# Patient Record
Sex: Female | Born: 1990
Health system: Southern US, Community
[De-identification: ages and names within clinical notes are randomized; demographics above are authoritative.]

## PROBLEM LIST (undated history)

## (undated) DIAGNOSIS — Z8042 Family history of malignant neoplasm of prostate: Secondary | ICD-10-CM

## (undated) DIAGNOSIS — N390 Urinary tract infection, site not specified: Secondary | ICD-10-CM

## (undated) DIAGNOSIS — Z803 Family history of malignant neoplasm of breast: Secondary | ICD-10-CM

## (undated) HISTORY — DX: Urinary tract infection, site not specified: N39.0

## (undated) HISTORY — DX: Family history of malignant neoplasm of breast: Z80.3

## (undated) HISTORY — DX: Family history of malignant neoplasm of prostate: Z80.42

## (undated) HISTORY — PX: WISDOM TOOTH EXTRACTION: SHX21

---
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
## 2010-10-02 ENCOUNTER — Emergency Department: Payer: Self-pay | Admitting: Emergency Medicine

## 2013-10-23 ENCOUNTER — Emergency Department (HOSPITAL_COMMUNITY)
Admission: EM | Admit: 2013-10-23 | Discharge: 2013-10-24 | Disposition: A | Discharge status: Home / Self Care | Payer: Worker's Compensation | Attending: Emergency Medicine | Admitting: Emergency Medicine

## 2013-10-23 ENCOUNTER — Encounter (HOSPITAL_COMMUNITY): Payer: Self-pay | Admitting: Emergency Medicine

## 2013-10-23 DIAGNOSIS — Y9289 Other specified places as the place of occurrence of the external cause: Secondary | ICD-10-CM | POA: Diagnosis not present

## 2013-10-23 DIAGNOSIS — Z23 Encounter for immunization: Secondary | ICD-10-CM | POA: Insufficient documentation

## 2013-10-23 DIAGNOSIS — IMO0001 Reserved for inherently not codable concepts without codable children: Secondary | ICD-10-CM

## 2013-10-23 DIAGNOSIS — IMO0002 Reserved for concepts with insufficient information to code with codable children: Secondary | ICD-10-CM | POA: Diagnosis present

## 2013-10-23 DIAGNOSIS — Y9389 Activity, other specified: Secondary | ICD-10-CM | POA: Insufficient documentation

## 2013-10-23 DIAGNOSIS — W460XXA Contact with hypodermic needle, initial encounter: Secondary | ICD-10-CM | POA: Insufficient documentation

## 2013-10-23 DIAGNOSIS — S6982XA Other specified injuries of left wrist, hand and finger(s), initial encounter: Secondary | ICD-10-CM

## 2013-10-23 LAB — RAPID HIV SCREEN (WH-MAU): Rapid HIV Screen: NONREACTIVE

## 2013-10-23 NOTE — ED Notes (Signed)
Pt. accidentally stuck by a needle in a trash bag at work [employer]/film[employer]video( [facility] ) this evening at left index finger tip , no bleeding , pt. stated the needle penetrated her glove . No bleeding .

## 2013-10-23 NOTE — ED Provider Notes (Signed)
CSN: [id number]     Arrival date & time 10/23/13  2200 History   First MD Initiated Contact with Patient 10/23/13 2309     This chart was scribed for non-physician practitioner, Dierdre Forth PA-C working with April Smitty Cords, MD by Arlan Organ, ED Scribe. This patient was seen in room TR07C/TR07C and the patient's care was started at 12:02 AM.   No chief complaint on file.  The history is provided by the patient and medical records. No language interpreter was used.    HPI Comments: Paula Lawrence is a 23 y.o. female who presents to the Emergency Department complaining of a possible minor puncture wound to the L index finger onset prior to arrival. Pt states she was taking the trash out at her [employer]) when she felt a "stick" from something sharp in the trash bag. She reports cleaning the area very well prior to arrival. Pt is here for precaution evaluation as there is some possibility that this was a needle stick. No fever or chills at this time. She is not currently taking any daily medications. She is unaware of her Tetanus status. No known allergies to medications. Pt has no pertinent past medical history. No other concerns this visit.   History reviewed. No pertinent past medical history. History reviewed. No pertinent past surgical history. No family history on file. History  Substance Use Topics  . Smoking status: Never Smoker   . Smokeless tobacco: Not on file  . Alcohol Use: Yes   OB History   Grav Para Term Preterm Abortions TAB SAB Ect Mult Living                 Review of Systems  Constitutional: Negative for fever, chills, diaphoresis, appetite change, fatigue and unexpected weight change.  HENT: Negative for mouth sores.   Eyes: Negative for visual disturbance.  Respiratory: Negative for cough, chest tightness, shortness of breath and wheezing.   Cardiovascular: Negative for chest pain.  Gastrointestinal: Negative for nausea, vomiting,  abdominal pain, diarrhea and constipation.  Endocrine: Negative for polydipsia, polyphagia and polyuria.  Genitourinary: Negative for dysuria, urgency, frequency and hematuria.  Musculoskeletal: Negative for back pain and neck stiffness.  Skin: Negative for rash.  Allergic/Immunologic: Negative for immunocompromised state.  Neurological: Negative for syncope, light-headedness and headaches.  Hematological: Does not bruise/bleed easily.  Psychiatric/Behavioral: Negative for sleep disturbance. The patient is not nervous/anxious.   All other systems reviewed and are negative.     Allergies  Review of patient's allergies indicates no known allergies.  Home Medications   Prior to Admission medications   Not on File   Triage Vitals: BP 133/77  Pulse 72  Temp(Src) 98.8 F (37.1 C) (Oral)  Resp 14  Ht 5\' 4"  (1.626 m)  Wt 208 lb (94.348 kg)  BMI 35.69 kg/m2  SpO2 100%   Physical Exam  Nursing note and vitals reviewed. Constitutional: She is oriented to person, place, and time. She appears well-developed and well-nourished. No distress.  Awake, alert, nontoxic appearance  HENT:  Head: Normocephalic and atraumatic.  Mouth/Throat: Oropharynx is clear and moist. No oropharyngeal exudate.  Eyes: Conjunctivae are normal. No scleral icterus.  Neck: Normal range of motion. Neck supple.  Cardiovascular: Normal rate, regular rhythm, normal heart sounds and intact distal pulses.   No murmur heard. Pulmonary/Chest: Effort normal and breath sounds normal. No respiratory distress. She has no wheezes.  Abdominal: Soft. Bowel sounds are normal. She exhibits no mass. There is no tenderness.  There is no rebound and no guarding.  Musculoskeletal: Normal range of motion. She exhibits no edema.  Neurological: She is alert and oriented to person, place, and time.  Speech is clear and goal oriented Moves extremities without ataxia  Skin: Skin is warm and dry. She is not diaphoretic. No erythema.   Inspection of the left pointer finger does not reveal any visible puncture wounds; no bleeding  Psychiatric: She has a normal mood and affect.    ED Course  Procedures (including critical care time)  DIAGNOSTIC STUDIES: Oxygen Saturation is 100% on RA, Normal by my interpretation.    COORDINATION OF CARE: 12:05 AM- Will order Rapid HIV and Hepatitis panel. Discussed treatment plan with pt at bedside and pt agreed to plan.     Labs Review Labs Reviewed  RAPID HIV SCREEN ([facility])  HEPATITIS PANEL, ACUTE    Imaging Review No results found.   EKG Interpretation None      MDM   Final diagnoses:  Needle stick injury of finger, left, initial encounter   Paula Lawrence presents with possible puncture wounds and concern for possible needle stick.  HIV and hepatitis panel drawn tonight. Tetanus updated.  [facility].  Wound cleaned again here in the emergency department. No visible puncture wound on exam.  Recommend that she keep clean with close observation and return here to the emergency department for signs of infection including fever, chills, erythema or purulent drainage. Patient is to followup with the health department as directed.  ,BP 133/77  Pulse 72  Temp(Src) 98.8 F (37.1 C) (Oral)  Resp 14  Ht 5\' 4"  (1.626 m)  Wt 208 lb (94.348 kg)  BMI 35.69 kg/m2  SpO2 100%   I personally performed the services described in this documentation, which was scribed in my presence. The recorded information has been reviewed and is accurate.    Dahlia ClientHannah Samirah Scarpati, PA-C 10/24/13 0023

## 2013-10-24 LAB — HEPATITIS PANEL, ACUTE
HCV Ab: NEGATIVE
Hep A IgM: NONREACTIVE
Hep B C IgM: NONREACTIVE
Hepatitis B Surface Ag: NEGATIVE

## 2013-10-24 MED ORDER — TETANUS-DIPHTH-ACELL PERTUSSIS 5-2.5-18.5 LF-MCG/0.5 IM SUSP
0.5000 mL | Freq: Once | INTRAMUSCULAR | Status: AC
  Administered 2013-10-24: 0.5 mL via INTRAMUSCULAR
  Filled 2013-10-24: qty 0.5

## 2013-10-24 NOTE — ED Provider Notes (Signed)
Medical screening examination/treatment/procedure(s) were performed by non-physician practitioner and as supervising physician I was immediately available for consultation/collaboration.   EKG Interpretation None       Marshell Dilauro K Carel Carrier-Rasch, MD 10/24/13 [phone]

## 2013-10-24 NOTE — Discharge Instructions (Signed)
1. Medications: usual home medications 2. Treatment: rest, drink plenty of fluids, keep wound clean with warm soap and water 3. Follow Up: Please [facility] for further evaluation and testing as needed    Needle Stick Injury A needle stick injury occurs when you are stuck by a needle that may have the blood from another person on it. Most of the time these injuries heal without any problem, but several diseases can be transmitted this way. You should be aware of the risks. A needle stick injury can cause risk for getting:  Hepatitis B.  Hepatitis C.  HIV infection (the virus that causes AIDS). The chance of getting one of these infections from a needle stick injury is small. However, it is important to take proper precautions to prevent such an injury. It is also important to understand and follow some health care recommendations when such an injury occurs.  RISK FACTORS In general, the risk of infection after a needle stick injury appears to be higher with:  Exposure to a needle that is visibly contaminated with blood.  Exposure to the blood of a patient with an advanced disease or with a high viral load.  A deep injury.  Needle placement in a vein or artery. PREVENTION  All health care workers should:  Wash their hands often, including before and after caring for each patient.  Receive the hepatitis B vaccine before any possible exposure to blood or bloody body fluids.  Use personal protective equipment (PPE) when appropriate. This includes:  Gloves.  Gowns.  Boots.  Shoe covers.  Eyewear.  Masks.  Wear gloves when any kind of venous or arterial access is being done.  Use safety devices when available.  Use sharp edges and needles with caution.  Dispose of used needles and other sharps in puncture proof receptacles.  Never recap needles. TREATMENT   After a needle stick injury, immediate cleansing with soap and water or  an alcohol-based hand hygiene agent is needed.  If you did not have a tetanus booster within the past 10 years, a booster shot should be given.  If the puncture site becomes red, swollen, more painful, or drains yellowish-white fluid (pus), medicine for a bacterial infection may be needed.  If the blood on the needle is known or thought to be high risk for hepatitis B or HIV, additional treatment is needed.  For needle stick exposures to the HIV virus, drug treatment is advised. This treatment is called post-exposure prophylaxis (PEP) and should be started as soon as possible following the injury. The recommended period of treatment with medicines is usually 4 weeks with 2 or more different drugs. You should have follow-up counseling and a medical evaluation, including HIV blood tests, right away. The tests should be repeated at 6 weeks, 3 months, and 6 months. Blood tests to monitor for drug toxicity effects of the PEP medicines are usually recommended immediately before treatment starts and again at 2 weeks and 4 weeks after the start of PEP. Additional recommendations during the first 6 to 12 weeks after exposure include:  Practicing sexual abstinence or using condoms to prevent sexual transmission and to avoid pregnancy.  Refraining from donating blood, plasma, semen, organs, or other tissue.  For breastfeeding women, considering temporary discontinuation of breastfeeding while on PEP.  For needle stick exposures to hepatitis B, blood testing and PEP is also needed. If you have not been vaccinated against hepatitis B, this vaccine series should be started  and hepatitis B immune globulin should also be given. If you have been previously vaccinated, your status of immunity to infection with hepatitis B can be tested by a blood antibody test. Before or after those test results are available, repeat vaccination with or without hepatitis B immune globulin will be considered.  Unfortunately, no  helpful treatment following hepatitis C exposure has been identified or is recommended. Follow-up blood testing is advised over a period of 4 weeks to 6 months to determine if the needle stick led to an infection. Ask your caregiver for advice about this follow-up testing. HOME CARE INSTRUCTIONS  Take medicine exactly as told by your caregiver.  Keep all follow-up appointments.  Do not share personal hygiene items. SEEK IMMEDIATE MEDICAL CARE IF:  You have concerns about your injury, treatment, or follow-up.  The injury site becomes red, swollen, or painful.  The injury site drains pus. MAKE SURE YOU:  Understand these instructions.  Will watch your condition.  Will get help right away if you are not doing well or get worse. Document Released: 03/15/2005 Document Revised: 07/30/2013 Document Reviewed: 08/18/2010 [facility] Patient Information 2015 [place], [place]. This information is not intended to replace advice given to you by your health care provider. Make sure you discuss any questions you have with your health care provider.

## 2014-08-18 ENCOUNTER — Encounter (HOSPITAL_COMMUNITY): Payer: Self-pay | Admitting: *Deleted

## 2014-08-18 ENCOUNTER — Emergency Department (HOSPITAL_COMMUNITY)
Admission: EM | Admit: 2014-08-18 | Discharge: 2014-08-18 | Disposition: A | Discharge status: Home / Self Care | Payer: Self-pay | Attending: Emergency Medicine | Admitting: Emergency Medicine

## 2014-08-18 DIAGNOSIS — R109 Unspecified abdominal pain: Secondary | ICD-10-CM | POA: Insufficient documentation

## 2014-08-18 DIAGNOSIS — Z3202 Encounter for pregnancy test, result negative: Secondary | ICD-10-CM | POA: Insufficient documentation

## 2014-08-18 LAB — PREGNANCY, URINE: Preg Test, Ur: NEGATIVE

## 2014-08-18 LAB — URINALYSIS, ROUTINE W REFLEX MICROSCOPIC
Bilirubin Urine: NEGATIVE
Glucose, UA: NEGATIVE mg/dL
KETONES UR: NEGATIVE mg/dL
Leukocytes, UA: NEGATIVE
Nitrite: NEGATIVE
PH: 6 (ref 5.0–8.0)
PROTEIN: NEGATIVE mg/dL
Specific Gravity, Urine: 1.009 (ref 1.005–1.030)
UROBILINOGEN UA: 0.2 mg/dL (ref 0.0–1.0)

## 2014-08-18 LAB — URINE MICROSCOPIC-ADD ON

## 2014-08-18 LAB — I-STAT CHEM 8, ED
BUN: 7 mg/dL (ref 6–20)
Calcium, Ion: 1.18 mmol/L (ref 1.12–1.23)
Chloride: 99 mmol/L — ABNORMAL LOW (ref 101–111)
Creatinine, Ser: 0.9 mg/dL (ref 0.44–1.00)
Glucose, Bld: 121 mg/dL — ABNORMAL HIGH (ref 65–99)
HCT: 36 % (ref 36.0–46.0)
Hemoglobin: 12.2 g/dL (ref 12.0–15.0)
POTASSIUM: 3.4 mmol/L — AB (ref 3.5–5.1)
SODIUM: 140 mmol/L (ref 135–145)
TCO2: 26 mmol/L (ref 0–100)

## 2014-08-18 MED ORDER — HYDROCODONE-ACETAMINOPHEN 5-325 MG PO TABS
2.0000 | ORAL_TABLET | Freq: Once | ORAL | Status: AC
  Administered 2014-08-18: 2 via ORAL
  Filled 2014-08-18: qty 2

## 2014-08-18 MED ORDER — HYDROCODONE-ACETAMINOPHEN 5-325 MG PO TABS: 1.0000 | ORAL_TABLET | Freq: Four times a day (QID) | ORAL | Status: DC | PRN

## 2014-08-18 NOTE — ED Provider Notes (Signed)
CSN: [id number]     Arrival date & time 08/18/14  2121 History   First MD Initiated Contact with Patient 08/18/14 2207     Chief Complaint  Patient presents with  . Flank Pain     (Consider location/radiation/quality/duration/timing/severity/associated sxs/prior Treatment) HPI Comments: Patient with no pertinent past medical history presents to the emergency department with chief complaint of left flank pain and "pressure"when she urinates. States symptoms have been ongoing for the past 3 days. She reports one episode of associated vomiting. She denies any fevers, chills, chest pain, shortness of breath, diarrhea, constipation, dysuria, or vaginal discharge. There are no aggravating or alleviating factors. Denies any pelvic pain. States pain is moderate.  The history is provided by the patient. No language interpreter was used.    History reviewed. No pertinent past medical history. Past Surgical History  Procedure Laterality Date  . Wisdom tooth extraction     No family history on file. History  Substance Use Topics  . Smoking status: Never Smoker   . Smokeless tobacco: Not on file  . Alcohol Use: Yes   OB History    No data available     Review of Systems  Constitutional: Negative for fever and chills.  Respiratory: Negative for shortness of breath.   Cardiovascular: Negative for chest pain.  Gastrointestinal: Positive for abdominal pain. Negative for nausea, vomiting, diarrhea and constipation.  Genitourinary: Negative for dysuria.  All other systems reviewed and are negative.     Allergies  Review of patient's allergies indicates no known allergies.  Home Medications   Prior to Admission medications   Medication Sig Start Date End Date Taking? Authorizing Provider  ibuprofen (ADVIL,MOTRIN) 200 MG tablet Take 400 mg by mouth every 6 (six) hours as needed for moderate pain.   Yes Historical Provider, MD   BP 117/80 mmHg  Pulse 82  Temp(Src) 98.3 F (36.8 C)  (Oral)  Resp 18  Ht 5\' 4"  (1.626 m)  Wt 224 lb (101.606 kg)  BMI 38.43 kg/m2  SpO2 100%  LMP 08/14/2014 Physical Exam  Constitutional: She is oriented to person, place, and time. She appears well-developed and well-nourished.  HENT:  Head: Normocephalic and atraumatic.  Eyes: Conjunctivae and EOM are normal. Pupils are equal, round, and reactive to light.  Neck: Normal range of motion. Neck supple.  Cardiovascular: Normal rate and regular rhythm.  Exam reveals no gallop and no friction rub.   No murmur heard. Pulmonary/Chest: Effort normal and breath sounds normal. No respiratory distress. She has no wheezes. She has no rales. She exhibits no tenderness.  Abdominal: Soft. Bowel sounds are normal. She exhibits no distension and no mass. There is no tenderness. There is no rebound and no guarding.  No focal abdominal tenderness, no RLQ tenderness or pain at McBurney's point, no RUQ tenderness or Murphy's sign, no left-sided abdominal tenderness, no fluid wave, or signs of peritonitis   Musculoskeletal: Normal range of motion. She exhibits no edema or tenderness.  Neurological: She is alert and oriented to person, place, and time.  Skin: Skin is warm and dry.  Psychiatric: She has a normal mood and affect. Her behavior is normal. Judgment and thought content normal.  Nursing note and vitals reviewed.   ED Course  Procedures (including critical care time) Results for orders placed or performed during the hospital encounter of 08/18/14  U/A (may I&O cath if menses)  Result Value Ref Range   Color, Urine YELLOW YELLOW   APPearance CLEAR CLEAR  Specific Gravity, Urine 1.009 1.005 - 1.030   pH 6.0 5.0 - 8.0   Glucose, UA NEGATIVE NEGATIVE mg/dL   Hgb urine dipstick TRACE (A) NEGATIVE   Bilirubin Urine NEGATIVE NEGATIVE   Ketones, ur NEGATIVE NEGATIVE mg/dL   Protein, ur NEGATIVE NEGATIVE mg/dL   Urobilinogen, UA 0.2 0.0 - 1.0 mg/dL   Nitrite NEGATIVE NEGATIVE   Leukocytes, UA  NEGATIVE NEGATIVE  Pregnancy, urine  Result Value Ref Range   Preg Test, Ur NEGATIVE NEGATIVE  Urine microscopic-add on  Result Value Ref Range   Squamous Epithelial / LPF FEW (A) RARE   RBC / HPF 0-2 <3 RBC/hpf  I-stat chem 8, ed  Result Value Ref Range   Sodium 140 135 - 145 mmol/L   Potassium 3.4 (L) 3.5 - 5.1 mmol/L   Chloride 99 (L) 101 - 111 mmol/L   BUN 7 6 - 20 mg/dL   Creatinine, Ser 1.19 0.44 - 1.00 mg/dL   Glucose, Bld 147 (H) 65 - 99 mg/dL   Calcium, Ion 8.29 5.62 - 1.23 mmol/L   TCO2 26 0 - 100 mmol/L   Hemoglobin 12.2 12.0 - 15.0 g/dL   HCT 13.0 86.5 - 78.4 %   No results found.    EKG Interpretation None      MDM   Final diagnoses:  Flank pain    Patient has had some left flank pain 3 days. She also reports urinary pressure. She is very well-appearing. Abdomen is soft and nontender. Will check urinalysis and chem 8. Will add urine pregnancy.  11:12 PM Patient with left flank pain and some pressure with urination.  UA is negative.  Will check urine culture.  I discussed with the patient that I do not think that there is an acute abdomen or emergent process, but could not find the etiology of patient's pain.  I offered her CT to further investigate the pain, but patient declines this today.  I instructed her to return to the ED if her symptoms worsen.  Patient understands and agrees with the plan.  The pain is not in the patient's pelvis, doubt ovarian etiology, no diarrhea, Cr is normal, trace Hgb in urine, but patient is just finishing her period and she is not pregnant.  Feel that the patient can be safely discharged to home with return precautions.  She understands and agrees with the plan.  Filed Vitals:   08/18/14 2317  BP: 139/71  Pulse: 65  Temp:   Resp: [address], PA-C 08/18/14 2322  Tilden Fossa, MD 08/18/14 [phone]

## 2014-08-18 NOTE — ED Notes (Signed)
Pt alert, oriented,and ambulatory upon DC. He was advised to follow up if not better. SH leaves with DC papers and script for vicodin in hand.

## 2014-08-18 NOTE — ED Notes (Signed)
Pt reports L flank pain with n/v since Friday.  Denies dysuria or hematuria but reports pressure when urinating.

## 2014-08-18 NOTE — Discharge Instructions (Signed)

## 2014-08-18 NOTE — ED Notes (Signed)
Patient is having pressure when she urinates. Patient also is having pain in left side and in pelvic area.

## 2014-08-20 LAB — URINE CULTURE

## 2014-09-28 ENCOUNTER — Encounter (HOSPITAL_COMMUNITY): Payer: Self-pay | Admitting: Emergency Medicine

## 2014-09-28 ENCOUNTER — Emergency Department (INDEPENDENT_AMBULATORY_CARE_PROVIDER_SITE_OTHER)
Admission: EM | Admit: 2014-09-28 | Discharge: 2014-09-28 | Disposition: A | Discharge status: Home / Self Care | Payer: Self-pay | Source: Home / Self Care | Attending: Emergency Medicine | Admitting: Emergency Medicine

## 2014-09-28 DIAGNOSIS — L0291 Cutaneous abscess, unspecified: Secondary | ICD-10-CM

## 2014-09-28 MED ORDER — HYDROCODONE-ACETAMINOPHEN 7.5-325 MG PO TABS: 1.0000 | ORAL_TABLET | ORAL | Status: DC | PRN

## 2014-09-28 MED ORDER — SULFAMETHOXAZOLE-TRIMETHOPRIM 800-160 MG PO TABS: 1.0000 | ORAL_TABLET | Freq: Two times a day (BID) | ORAL | Status: DC

## 2014-09-28 NOTE — ED Provider Notes (Signed)
CSN: [id number]     Arrival date & time 09/28/14  1625 History   First MD Initiated Contact with Patient 09/28/14 1902     Chief Complaint  Patient presents with  . Abscess   (Consider location/radiation/quality/duration/timing/severity/associated sxs/prior Treatment) HPI Comments: 24 year old female states that she developed a boil under the left arm 2 days ago. Is continuing get larger and more painful.   History reviewed. No pertinent past medical history. Past Surgical History  Procedure Laterality Date  . Wisdom tooth extraction     No family history on file. History  Substance Use Topics  . Smoking status: Never Smoker   . Smokeless tobacco: Not on file  . Alcohol Use: Yes   OB History    No data available     Review of Systems  Constitutional: Negative.   Skin: Negative for rash.       As per history of present illness.  All other systems reviewed and are negative.   Allergies  Review of patient's allergies indicates no known allergies.  Home Medications   Prior to Admission medications   Medication Sig Start Date End Date Taking? Authorizing Provider  HYDROcodone-acetaminophen (NORCO) 7.5-325 MG per tablet Take 1 tablet by mouth every 4 (four) hours as needed. 09/28/14   Hayden Rasmussenavid Anniah Glick, NP  ibuprofen (ADVIL,MOTRIN) 200 MG tablet Take 400 mg by mouth every 6 (six) hours as needed for moderate pain.    Historical Provider, MD  sulfamethoxazole-trimethoprim (BACTRIM DS,SEPTRA DS) 800-160 MG per tablet Take 1 tablet by mouth 2 (two) times daily. 09/28/14 10/05/14  Hayden Rasmussenavid Chaka Jefferys, NP   BP 146/82 mmHg  Pulse 88  Temp(Src) 98 F (36.7 C) (Oral)  Resp 18  SpO2 98%  LMP 09/14/2014 Physical Exam  Constitutional: She is oriented to person, place, and time. She appears well-developed and well-nourished. No distress.  Pulmonary/Chest: Effort normal. No respiratory distress.  Neurological: She is alert and oriented to person, place, and time. She exhibits normal muscle tone.  Skin:  Skin is warm and dry.  Proximally 6 cm abscess to the left axilla. Fluctuant, tender with mild erythema. No surrounding erythema to help the scan.  Nursing note and vitals reviewed.   ED Course  INCISION AND DRAINAGE Date/Time: 09/28/2014 7:38 PM Performed by: Phineas RealMABE, Krystiana Fornes Authorized by: Charm RingsHONIG, ERIN J Consent: Verbal consent obtained. Risks and benefits: risks, benefits and alternatives were discussed Consent given by: patient Patient understanding: patient states understanding of the procedure being performed Patient identity confirmed: verbally with patient Type: abscess Body area: upper extremity Location details: left arm Anesthesia: local infiltration Local anesthetic: lidocaine 2% with epinephrine Anesthetic total: 8 ml Patient sedated: no Scalpel size: 11 Incision type: single with marsupialization Complexity: complex Drainage: purulent Drainage amount: copious Wound treatment: drain placed Packing material: 1/4 in iodoform gauze Patient tolerance: Patient tolerated the procedure well with no immediate complications   (including critical care time) Labs Review Labs Reviewed  CULTURE, ROUTINE-ABSCESS    Imaging Review No results found.   MDM   1. Abscess    Keep dry RTO 2 d Septra dws bid norco 7.5 #15 Wound culture pending   Hayden Rasmussenavid Lathan Gieselman, NP 09/28/14 1939

## 2014-09-28 NOTE — ED Notes (Signed)
Abscess under left arm.  History of the same.  Patient has never needed to have I/D performed on abscess.  Noticed abscess on Thursday 6/30

## 2014-09-28 NOTE — Discharge Instructions (Signed)

## 2014-10-01 ENCOUNTER — Encounter (HOSPITAL_COMMUNITY): Payer: Self-pay | Admitting: Emergency Medicine

## 2014-10-01 ENCOUNTER — Emergency Department (INDEPENDENT_AMBULATORY_CARE_PROVIDER_SITE_OTHER)
Admission: EM | Admit: 2014-10-01 | Discharge: 2014-10-01 | Disposition: A | Discharge status: Home / Self Care | Payer: Self-pay | Source: Home / Self Care | Attending: Emergency Medicine | Admitting: Emergency Medicine

## 2014-10-01 DIAGNOSIS — L02412 Cutaneous abscess of left axilla: Secondary | ICD-10-CM

## 2014-10-01 MED ORDER — DOXYCYCLINE HYCLATE 100 MG PO CAPS: 100.0000 mg | ORAL_CAPSULE | Freq: Two times a day (BID) | ORAL | Status: DC

## 2014-10-01 NOTE — ED Notes (Signed)
Here for a f/u and to have abscess checked Reports she had an allergic reaction to Bactrim... Started to have a rash around mouth and itching of lips She stopped taking it... Reports she is feeling much better Alert, no signs of acute distress.

## 2014-10-01 NOTE — ED Notes (Signed)
Applied 4*4 dressing and secured w/hypofix

## 2014-10-01 NOTE — ED Notes (Signed)
Final report of abscess culture still pending

## 2014-10-01 NOTE — ED Provider Notes (Signed)
CSN: [id number]     Arrival date & time 10/01/14  1651 History   First MD Initiated Contact with Patient 10/01/14 1841     Chief Complaint  Patient presents with  . Follow-up   (Consider location/radiation/quality/duration/timing/severity/associated sxs/prior Treatment) HPI  She is a 24 year old woman here for follow-up of left axillary abscess. She was seen here 2 days ago and an I&D was performed. She is here today for packing removal. She was placed on Bactrim 2 days ago, but developed a rash. She called the office and she was instructed to stop taking the antibiotics. She states she had significant drainage from the abscess yesterday, but this is improved today. No fevers or chills.  History reviewed. No pertinent past medical history. Past Surgical History  Procedure Laterality Date  . Wisdom tooth extraction     No family history on file. History  Substance Use Topics  . Smoking status: Never Smoker   . Smokeless tobacco: Not on file  . Alcohol Use: Yes   OB History    No data available     Review of Systems As in history of present illness Allergies  Review of patient's allergies indicates no known allergies.  Home Medications   Prior to Admission medications   Medication Sig Start Date End Date Taking? Authorizing Provider  doxycycline (VIBRAMYCIN) 100 MG capsule Take 1 capsule (100 mg total) by mouth 2 (two) times daily. 10/01/14   Charm RingsErin J Khoury Siemon, MD  HYDROcodone-acetaminophen (NORCO) 7.5-325 MG per tablet Take 1 tablet by mouth every 4 (four) hours as needed. 09/28/14   Hayden Rasmussenavid Mabe, NP  ibuprofen (ADVIL,MOTRIN) 200 MG tablet Take 400 mg by mouth every 6 (six) hours as needed for moderate pain.    Historical Provider, MD   BP 126/86 mmHg  Pulse 67  Temp(Src) 98 F (36.7 C) (Oral)  Resp 20  SpO2 99%  LMP 09/14/2014 Physical Exam  Constitutional: She is oriented to person, place, and time. She appears well-developed and well-nourished. No distress.  Cardiovascular:  Normal rate.   Pulmonary/Chest: Effort normal.  Neurological: She is alert and oriented to person, place, and time.  Skin:  Left axillary abscess packing removed. There is still active drainage. Some mild induration in the area. No surrounding erythema.    ED Course  Procedures (including critical care time)  Verbal consent obtained for packing removal and repacking. Packing was removed. Quarter-inch iodoform gauze was used to repack the wound. Bandage applied. Patient tolerated procedure well.  Labs Review Labs Reviewed - No data to display  Imaging Review No results found.   MDM   1. Abscess of axilla, left    Repacked today. If there is significant drainage in 2 days, she will return for reevaluation. Otherwise, she will remove the packing herself. Prescription for doxycycline given. She does not need to fill this unless redness and swelling occurs.    Charm RingsErin J Kannon Baum, MD 10/01/14 [phone]

## 2014-10-01 NOTE — Discharge Instructions (Signed)
We repacked the abscess today. You do not need to fill the antibiotics unless the area becomes red and swollen. If you are having a lot of drainage, please come back in 2 days. If there is not much drainage, you can remove the packing herself in 2 days.

## 2014-10-03 LAB — CULTURE, ROUTINE-ABSCESS: SPECIAL REQUESTS: NORMAL

## 2015-06-28 ENCOUNTER — Emergency Department (INDEPENDENT_AMBULATORY_CARE_PROVIDER_SITE_OTHER)
Admission: EM | Admit: 2015-06-28 | Discharge: 2015-06-28 | Disposition: A | Discharge status: Home / Self Care | Payer: Self-pay | Source: Home / Self Care | Attending: Emergency Medicine | Admitting: Emergency Medicine

## 2015-06-28 ENCOUNTER — Other Ambulatory Visit (HOSPITAL_COMMUNITY)
Admission: RE | Admit: 2015-06-28 | Discharge: 2015-06-28 | Disposition: A | Discharge status: Home / Self Care | Payer: Self-pay | Source: Ambulatory Visit | Attending: Emergency Medicine | Admitting: Emergency Medicine

## 2015-06-28 ENCOUNTER — Encounter (HOSPITAL_COMMUNITY): Payer: Self-pay | Admitting: Emergency Medicine

## 2015-06-28 DIAGNOSIS — L0291 Cutaneous abscess, unspecified: Secondary | ICD-10-CM | POA: Insufficient documentation

## 2015-06-28 MED ORDER — CLINDAMYCIN HCL 300 MG PO CAPS: 300.0000 mg | ORAL_CAPSULE | Freq: Four times a day (QID) | ORAL | Status: DC

## 2015-06-28 MED ORDER — LIDOCAINE HCL (PF) 1 % IJ SOLN
INTRAMUSCULAR | Status: AC
  Filled 2015-06-28: qty 30

## 2015-06-28 NOTE — ED Notes (Signed)
Patient c/o abscess in left axilla x 2 days. Patient reports it appeared last week and began to drain on its own. Now it has come to a head again and is painful. This is a recurrent problem she frequently has to get I&D. Patient is in NAD.

## 2015-06-28 NOTE — ED Provider Notes (Signed)
CSN: [id number]     Arrival date & time 06/28/15  1344 History   First MD Initiated Contact with Patient 06/28/15 1543     Chief Complaint  Patient presents with  . Abscess   (Consider location/radiation/quality/duration/timing/severity/associated sxs/prior Treatment) HPI History obtained from patient:   LOCATION:left axilla SEVERITY:6 DURATION:several days CONTEXT:suden onset, shaved hair a couple of weeks ago.  QUALITY:similar to previous abscess MODIFYING FACTORS:hot packs ASSOCIATED SYMPTOMS:none TIMING:constant OCCUPATION:NA  History reviewed. No pertinent past medical history. Past Surgical History  Procedure Laterality Date  . Wisdom tooth extraction     History reviewed. No pertinent family history. Social History  Substance Use Topics  . Smoking status: Never Smoker   . Smokeless tobacco: None  . Alcohol Use: Yes   OB History    No data available     Review of Systems Boil under left arm Allergies  Bactrim  Home Medications   Prior to Admission medications   Medication Sig Start Date End Date Taking? Authorizing Provider  clindamycin (CLEOCIN) 300 MG capsule Take 1 capsule (300 mg total) by mouth every 6 (six) hours. 06/28/15   Tharon Aquas, PA  doxycycline (VIBRAMYCIN) 100 MG capsule Take 1 capsule (100 mg total) by mouth 2 (two) times daily. 10/01/14   Charm Rings, MD  HYDROcodone-acetaminophen (NORCO) 7.5-325 MG per tablet Take 1 tablet by mouth every 4 (four) hours as needed. 09/28/14   Hayden Rasmussen, NP  ibuprofen (ADVIL,MOTRIN) 200 MG tablet Take 400 mg by mouth every 6 (six) hours as needed for moderate pain.    Historical Provider, MD   Meds Ordered and Administered this Visit  Medications - No data to display  BP 145/81 mmHg  Pulse 90  Temp(Src) 98.2 F (36.8 C) (Oral)  SpO2 100%  LMP 06/17/2015 No data found.   Physical Exam NURSES NOTES AND VITAL SIGNS REVIEWED. CONSTITUTIONAL: Well developed, well nourished, no acute distress HEENT:  normocephalic, atraumatic EYES: Conjunctiva normal NECK:normal ROM, supple, no adenopathy PULMONARY:No respiratory distress, normal effort MUSCULOSKELETAL: Normal ROM of all extremities,  SKIN: warm and dry without rash. Left axilla, swollen fluctuant, tender to palpation. Visible sites of incisions of the past.  PSYCHIATRIC: Mood and affect, behavior are normal  ED Course  .Marland KitchenIncision and Drainage Date/Time: 06/28/2015 4:24 PM Performed by: Tharon Aquas Authorized by: Charm Rings Consent: Verbal consent obtained. Risks and benefits: risks, benefits and alternatives were discussed Consent given by: patient Patient identity confirmed: verbally with patient Type: abscess Body area: upper extremity Location details: left arm Anesthesia: local infiltration Local anesthetic: co-phenylcaine spray and lidocaine 1% without epinephrine Anesthetic total: 3 ml Patient sedated: no Scalpel size: 11 Incision type: single straight Incision depth: subcutaneous Complexity: simple Drainage: purulent Drainage amount: moderate Wound treatment: drain placed Packing material: 1/4 in iodoform gauze Patient tolerance: Patient tolerated the procedure well with no immediate complications   (including critical care time)  Labs Review Labs Reviewed  CULTURE, ROUTINE-ABSCESS    Imaging Review No results found.   Visual Acuity Review  Right Eye Distance:   Left Eye Distance:   Bilateral Distance:    Right Eye Near:   Left Eye Near:    Bilateral Near:      RX for clindamycin  TX I&D culture obtained    MDM   1. Abscess     Patient is reassured that there are no issues that require transfer to higher level of care at this time or additional tests. Patient is advised to continue  home symptomatic treatment. Patient is advised that if there are new or worsening symptoms to attend the emergency department, contact primary care provider, or return to UC. Instructions of care  provided discharged home in stable condition.    THIS NOTE WAS GENERATED USING A VOICE RECOGNITION SOFTWARE PROGRAM. ALL REASONABLE EFFORTS  WERE MADE TO PROOFREAD THIS DOCUMENT FOR ACCURACY.  I have verbally reviewed the discharge instructions with the patient. A printed AVS was given to the patient.  All questions were answered prior to discharge.      Tharon AquasFrank C Patrick, PA 06/28/15 1625

## 2015-06-28 NOTE — Discharge Instructions (Signed)
Incision and Drainage °Incision and drainage is a procedure in which a sac-like structure (cystic structure) is opened and drained. The area to be drained usually contains material such as pus, fluid, or blood.  °LET YOUR CAREGIVER KNOW ABOUT:  °· Allergies to medicine. °· Medicines taken, including vitamins, herbs, eyedrops, over-the-counter medicines, and creams. °· Use of steroids (by mouth or creams). °· Previous problems with anesthetics or numbing medicines. °· History of bleeding problems or blood clots. °· Previous surgery. °· Other health problems, including diabetes and kidney problems. °· Possibility of pregnancy, if this applies. °RISKS AND COMPLICATIONS °· Pain. °· Bleeding. °· Scarring. °· Infection. °BEFORE THE PROCEDURE  °You may need to have an ultrasound or other imaging tests to see how large or deep your cystic structure is. Blood tests may also be used to determine if you have an infection or how severe the infection is. You may need to have a tetanus shot. °PROCEDURE  °The affected area is cleaned with a cleaning fluid. The cyst area will then be numbed with a medicine (local anesthetic). A small incision will be made in the cystic structure. A syringe or catheter may be used to drain the contents of the cystic structure, or the contents may be squeezed out. The area will then be flushed with a cleansing solution. After cleansing the area, it is often gently packed with a gauze or another wound dressing. Once it is packed, it will be covered with gauze and tape or some other type of wound dressing.  °AFTER THE PROCEDURE  °· Often, you will be allowed to go home right after the procedure. °· You may be given antibiotic medicine to prevent or heal an infection. °· If the area was packed with gauze or some other wound dressing, you will likely need to come back in 1 to 2 days to get it removed. °· The area should heal in about 14 days. °  °This information is not intended to replace advice given  to you by your health care provider. Make sure you discuss any questions you have with your health care provider. °  °Document Released: 09/08/2000 Document Revised: 09/14/2011 Document Reviewed: 05/10/2011 °Elsevier Interactive Patient Education ©2016 [employer] ° °Abscess °An abscess (boil or furuncle) is an infected area on or under the skin. This area is filled with yellowish-white fluid (pus) and other material (debris). °HOME CARE  °· Only take medicines as told by your doctor. °· If you were given antibiotic medicine, take it as directed. Finish the medicine even if you start to feel better. °· If gauze is used, follow your doctor's directions for changing the gauze. °· To avoid spreading the infection: °¨ Keep your abscess covered with a bandage. °¨ Wash your hands well. °¨ Do not share personal care items, towels, or whirlpools with others. °¨ Avoid skin contact with others. °· Keep your skin and clothes clean around the abscess. °· Keep all doctor visits as told. °GET HELP RIGHT AWAY IF:  °· You have more pain, puffiness (swelling), or redness in the wound site. °· You have more fluid or blood coming from the wound site. °· You have muscle aches, chills, or you feel sick. °· You have a fever. °MAKE SURE YOU:  °· Understand these instructions. °· Will watch your condition. °· Will get help right away if you are not doing well or get worse. °  °This information is not intended to replace advice given to you by your health care provider.   Make sure you discuss any questions you have with your health care provider. °  °Document Released: 09/01/2007 Document Revised: 09/14/2011 Document Reviewed: 05/29/2011 °Elsevier Interactive Patient Education ©2016 [employer] Inc[employer] ° °

## 2015-07-03 LAB — CULTURE, ROUTINE-ABSCESS
CULTURE: NORMAL
Special Requests: NORMAL

## 2016-02-04 ENCOUNTER — Ambulatory Visit (HOSPITAL_COMMUNITY)
Admission: EM | Admit: 2016-02-04 | Discharge: 2016-02-04 | Disposition: A | Discharge status: Home / Self Care | Payer: Self-pay | Attending: Family Medicine | Admitting: Family Medicine

## 2016-02-04 ENCOUNTER — Encounter (HOSPITAL_COMMUNITY): Payer: Self-pay | Admitting: *Deleted

## 2016-02-04 DIAGNOSIS — L02412 Cutaneous abscess of left axilla: Secondary | ICD-10-CM

## 2016-02-04 MED ORDER — DOXYCYCLINE HYCLATE 100 MG PO TABS: 100.0000 mg | ORAL_TABLET | Freq: Two times a day (BID) | ORAL | 3 refills | Status: DC

## 2016-02-04 MED ORDER — LIDOCAINE-EPINEPHRINE (PF) 2 %-1:200000 IJ SOLN
INTRAMUSCULAR | Status: AC
  Filled 2016-02-04: qty 20

## 2016-02-04 NOTE — Discharge Instructions (Signed)
Get in the shower and really soak the left armpit twice a day

## 2016-02-04 NOTE — ED Provider Notes (Signed)
[facility]    CSN: [id number] Arrival date & time: 02/04/16  1235     History   Chief Complaint Chief Complaint  Patient presents with  . Abscess    HPI Paula Lawrence is a 25 y.o. female.   This is a 25 year old woman who has recurrent abscess in her left axilla. She's here today with swelling and tenderness, similar to past episodes, which have been going on for a week. She's had this problem several times before.      History reviewed. No pertinent past medical history.  There are no active problems to display for this patient.   Past Surgical History:  Procedure Laterality Date  . WISDOM TOOTH EXTRACTION      OB History    No data available       Home Medications    Prior to Admission medications   Medication Sig Start Date End Date Taking? Authorizing Provider  doxycycline (VIBRA-TABS) 100 MG tablet Take 1 tablet (100 mg total) by mouth 2 (two) times daily. 02/04/16   Elvina SidleKurt Makaela Cando, MD    Family History History reviewed. No pertinent family history.  Social History Social History  Substance Use Topics  . Smoking status: Never Smoker  . Smokeless tobacco: Not on file  . Alcohol use Yes     Allergies   Bactrim [sulfamethoxazole-trimethoprim]   Review of Systems Review of Systems  Constitutional: Negative.   HENT: Negative.   Eyes: Negative.   Respiratory: Negative.   Cardiovascular: Negative.   Gastrointestinal: Negative.   Musculoskeletal: Positive for myalgias.     Physical Exam Triage Vital Signs ED Triage Vitals [02/04/16 1259]  Enc Vitals Group     BP 137/78     Pulse Rate 82     Resp 18     Temp 97.9 F (36.6 C)     Temp Source Oral     SpO2 100 %     Weight      Height      Head Circumference      Peak Flow      Pain Score      Pain Loc      Pain Edu?      Excl. in GC?    No data found.   Updated Vital Signs BP 137/78 (BP Location: Right Arm)   Pulse 82   Temp 97.9 F (36.6 C) (Oral)    Resp 18   LMP 01/06/2016   SpO2 100%      Physical Exam  Constitutional: She appears well-developed and well-nourished.  Musculoskeletal:  Pain with abduction of left arm  Skin: Skin is warm and dry. There is erythema.  Marked tenderness and swelling and left axilla with palpation  Nursing note and vitals reviewed.    UC Treatments / Results  Labs (all labs ordered are listed, but only abnormal results are displayed) Labs Reviewed - No data to display  EKG  EKG Interpretation None       Radiology No results found.  Procedures .Marland Kitchen.Incision and Drainage Date/Time: 02/04/2016 1:37 PM Performed by: Elvina SidleLAUENSTEIN, Lada Fulbright Authorized by: Elvina SidleLAUENSTEIN, Emalynn Clewis   Consent:    Consent obtained:  Verbal   Consent given by:  Patient   Risks discussed:  Incomplete drainage and infection   Alternatives discussed:  No treatment Location:    Type:  Abscess   Size:  1.5 cm   Location:  Upper extremity   Upper extremity location:  Arm   Arm location:  L  upper arm Pre-procedure details:    Skin preparation:  Antiseptic wash Anesthesia (see MAR for exact dosages):    Anesthesia method:  Local infiltration   Local anesthetic:  Lidocaine 1% WITH epi Procedure type:    Complexity:  Simple Procedure details:    Needle aspiration: no     Incision types:  Stab incision   Incision depth:  Subcutaneous   Scalpel blade:  11   Wound management:  Probed and deloculated   Drainage:  Bloody and purulent   Drainage amount:  Copious   Wound treatment:  Wound left open   Packing materials:  None Post-procedure details:    Patient tolerance of procedure:  Tolerated well, no immediate complications   (including critical care time)  Medications Ordered in UC Medications - No data to display   Initial Impression / Assessment and Plan / UC Course  I have reviewed the triage vital signs and the nursing notes.  Pertinent labs & imaging results that were available during my care of the patient  were reviewed by me and considered in my medical decision making (see chart for details).  Clinical Course     Final Clinical Impressions(s) / UC Diagnoses   Final diagnoses:  Abscess of left axilla    New Prescriptions New Prescriptions   DOXYCYCLINE (VIBRA-TABS) 100 MG TABLET    Take 1 tablet (100 mg total) by mouth 2 (two) times daily.     Elvina SidleKurt Reg Bircher, MD 02/04/16 1340

## 2016-02-04 NOTE — ED Triage Notes (Signed)
Pt    Reports       Boil  Under  l  Armpit  X   1   Week      Pt    Reports     The  Boil is  reoccuring      And     It  Is  Not  Draining  At  This  Time

## 2016-05-03 ENCOUNTER — Encounter (HOSPITAL_BASED_OUTPATIENT_CLINIC_OR_DEPARTMENT_OTHER): Payer: Self-pay

## 2016-05-03 DIAGNOSIS — J219 Acute bronchiolitis, unspecified: Secondary | ICD-10-CM | POA: Insufficient documentation

## 2016-05-03 DIAGNOSIS — J209 Acute bronchitis, unspecified: Secondary | ICD-10-CM | POA: Diagnosis not present

## 2016-05-03 DIAGNOSIS — J111 Influenza due to unidentified influenza virus with other respiratory manifestations: Secondary | ICD-10-CM | POA: Diagnosis not present

## 2016-05-03 DIAGNOSIS — R05 Cough: Secondary | ICD-10-CM | POA: Diagnosis present

## 2016-05-03 NOTE — ED Triage Notes (Signed)
C/o flu s/s x 5 days-NAD-steady gait 

## 2016-05-04 ENCOUNTER — Emergency Department (HOSPITAL_BASED_OUTPATIENT_CLINIC_OR_DEPARTMENT_OTHER): Payer: 59

## 2016-05-04 ENCOUNTER — Emergency Department (HOSPITAL_BASED_OUTPATIENT_CLINIC_OR_DEPARTMENT_OTHER)
Admission: EM | Admit: 2016-05-04 | Discharge: 2016-05-04 | Disposition: A | Discharge status: Home / Self Care | Payer: 59 | Attending: Emergency Medicine | Admitting: Emergency Medicine

## 2016-05-04 DIAGNOSIS — R69 Illness, unspecified: Secondary | ICD-10-CM

## 2016-05-04 DIAGNOSIS — J209 Acute bronchitis, unspecified: Secondary | ICD-10-CM

## 2016-05-04 DIAGNOSIS — J219 Acute bronchiolitis, unspecified: Secondary | ICD-10-CM | POA: Diagnosis not present

## 2016-05-04 DIAGNOSIS — R0602 Shortness of breath: Secondary | ICD-10-CM | POA: Diagnosis not present

## 2016-05-04 DIAGNOSIS — J111 Influenza due to unidentified influenza virus with other respiratory manifestations: Secondary | ICD-10-CM

## 2016-05-04 DIAGNOSIS — R05 Cough: Secondary | ICD-10-CM | POA: Diagnosis not present

## 2016-05-04 MED ORDER — ALBUTEROL SULFATE HFA 108 (90 BASE) MCG/ACT IN AERS
2.0000 | INHALATION_SPRAY | RESPIRATORY_TRACT | Status: DC | PRN
  Administered 2016-05-04: 2 via RESPIRATORY_TRACT

## 2016-05-04 MED ORDER — ALBUTEROL SULFATE HFA 108 (90 BASE) MCG/ACT IN AERS
INHALATION_SPRAY | RESPIRATORY_TRACT | Status: AC
  Administered 2016-05-04: 2 via RESPIRATORY_TRACT
  Filled 2016-05-04: qty 6.7

## 2016-05-04 NOTE — ED Provider Notes (Signed)
[facility] Provider Note: Lowella DellJ. Lane Shaun Zuccaro, MD, FACEP  CSN: [id number] MRN: [id number] ARRIVAL: 05/03/16 at 2156 ROOM: [facility]   CHIEF COMPLAINT  Cough   HISTORY OF PRESENT ILLNESS  Paula Lawrence is a 26 y.o. female with a five-day history of flulike symptoms. This began with sore throat and has progressed to include nasal congestion, rhinorrhea, dry cough, body aches, chills, low-grade fever and malaise. Her principal complaint is her cough which is persistent and is causing her chest to be sore. She has had a decreased appetite but no nausea, vomiting or diarrhea. She has been taking over-the-counter cough and cold medications without adequate relief.   History reviewed. No pertinent past medical history.  Past Surgical History:  Procedure Laterality Date  . WISDOM TOOTH EXTRACTION      No family history on file.  Social History  Substance Use Topics  . Smoking status: Never Smoker  . Smokeless tobacco: Never Used  . Alcohol use Yes     Comment: occ    Prior to Admission medications   Not on File    Allergies Bactrim [sulfamethoxazole-trimethoprim]   REVIEW OF SYSTEMS  Negative except as noted here or in the History of Present Illness.   PHYSICAL EXAMINATION  Initial Vital Signs Blood pressure 121/90, pulse 87, temperature 98.6 F (37 C), temperature source Oral, resp. rate 20, height 5\' 5"  (1.651 m), weight 227 lb (103 kg), last menstrual period 04/12/2016, SpO2 100 %.  Examination General: Well-developed, well-nourished female in no acute distress; appearance consistent with age of record HENT: normocephalic; atraumatic; nasal congestion; no pharyngeal erythema or exudate Eyes: pupils equal, round and reactive to light; extraocular muscles intact Neck: supple Heart: regular rate and rhythm Lungs: Decreased air movement in bases; frequent cough, exacerbated by deep breathing Abdomen: soft; nondistended; nontender; bowel sounds  present Extremities: No deformity; full range of motion; pulses normal Neurologic: Awake, alert and oriented; motor function intact in all extremities and symmetric; no facial droop Skin: Warm and dry Psychiatric: Normal mood and affect   RESULTS  Summary of this visit's results, reviewed by myself:   EKG Interpretation  Date/Time:    Ventricular Rate:    PR Interval:    QRS Duration:   QT Interval:    QTC Calculation:   R Axis:     Text Interpretation:        Laboratory Studies: No results found for this or any previous visit (from the past 24 hour(s)). Imaging Studies: Dg Chest 2 View  Result Date: 05/04/2016 CLINICAL DATA:  Cough with shortness of breath EXAM: CHEST  2 VIEW COMPARISON:  None. FINDINGS: The heart size and mediastinal contours are within normal limits. Both lungs are clear. The visualized skeletal structures are unremarkable. IMPRESSION: No active cardiopulmonary disease. Electronically Signed   By: Jasmine PangKim  Fujinaga M.D.   On: 05/04/2016 00:56    ED COURSE  Nursing notes and initial vitals signs, including pulse oximetry, reviewed.  Vitals:   05/03/16 2203 05/04/16 0038  BP: 125/88 121/90  Pulse: 96 87  Resp: 20 20  Temp: 99 F (37.2 C) 98.6 F (37 C)  TempSrc: Oral Oral  SpO2: 100% 100%  Weight: 227 lb (103 kg)   Height: 5\' 5"  (1.651 m)    2:11 AM We will provide an inhaler to treat her acute bronchitis. She was advised to avoid duplicating ingredients in the over-the-counter cough and cold medications she is using. She was advised an NSAID such as ibuprofen or naproxen  sodium plus Mucinex DM is a good combination.  PROCEDURES    ED DIAGNOSES     ICD-9-CM ICD-10-CM   1. Acute bronchitis with bronchospasm 466.0 J20.9   2. Influenza-like illness 799.89 R69        Paula Libra, MD 05/04/16 [phone]

## 2016-05-31 ENCOUNTER — Encounter (HOSPITAL_COMMUNITY): Payer: Self-pay | Admitting: Emergency Medicine

## 2016-05-31 ENCOUNTER — Ambulatory Visit (HOSPITAL_COMMUNITY)
Admission: EM | Admit: 2016-05-31 | Discharge: 2016-05-31 | Disposition: A | Discharge status: Home / Self Care | Payer: 59 | Attending: Family Medicine | Admitting: Family Medicine

## 2016-05-31 DIAGNOSIS — R319 Hematuria, unspecified: Secondary | ICD-10-CM | POA: Diagnosis not present

## 2016-05-31 DIAGNOSIS — R300 Dysuria: Secondary | ICD-10-CM | POA: Diagnosis not present

## 2016-05-31 DIAGNOSIS — N390 Urinary tract infection, site not specified: Secondary | ICD-10-CM | POA: Insufficient documentation

## 2016-05-31 LAB — POCT URINALYSIS DIP (DEVICE)
Bilirubin Urine: NEGATIVE
GLUCOSE, UA: NEGATIVE mg/dL
KETONES UR: NEGATIVE mg/dL
Nitrite: NEGATIVE
Protein, ur: >=300 mg/dL — AB
Specific Gravity, Urine: 1.025 (ref 1.005–1.030)
Urobilinogen, UA: 0.2 mg/dL (ref 0.0–1.0)
pH: 7 (ref 5.0–8.0)

## 2016-05-31 MED ORDER — PHENAZOPYRIDINE HCL 200 MG PO TABS: 200.0000 mg | ORAL_TABLET | Freq: Three times a day (TID) | ORAL | 0 refills | Status: DC

## 2016-05-31 MED ORDER — CEPHALEXIN 500 MG PO CAPS: 500.0000 mg | ORAL_CAPSULE | Freq: Four times a day (QID) | ORAL | 0 refills | Status: DC

## 2016-05-31 NOTE — ED Triage Notes (Signed)
Patient stated that she has urinary frequency, and pressure started today. Patient denied dysuria.

## 2016-05-31 NOTE — ED Provider Notes (Signed)
CSN: [id number]     Arrival date & time 05/31/16  1746 History   First MD Initiated Contact with Patient 05/31/16 1812     No chief complaint on file.  (Consider location/radiation/quality/duration/timing/severity/associated sxs/prior Treatment) Patient c/o urinary frequency and dysuria today.   The history is provided by the patient.  Dysuria  Pain quality:  Burning Pain severity:  Mild Onset quality:  Sudden Duration:  1 day Timing:  Constant Chronicity:  New Recent urinary tract infections: yes   Relieved by:  Nothing Worsened by:  Nothing Urinary symptoms: frequent urination     History reviewed. No pertinent past medical history. Past Surgical History:  Procedure Laterality Date  . WISDOM TOOTH EXTRACTION     History reviewed. No pertinent family history. Social History  Substance Use Topics  . Smoking status: Never Smoker  . Smokeless tobacco: Never Used  . Alcohol use Yes     Comment: occ   OB History    No data available     Review of Systems  Constitutional: Negative.   HENT: Negative.   Eyes: Negative.   Respiratory: Negative.   Cardiovascular: Negative.   Gastrointestinal: Negative.   Endocrine: Negative.   Genitourinary: Positive for dysuria.  Musculoskeletal: Negative.   Allergic/Immunologic: Negative.   Hematological: Negative.   Psychiatric/Behavioral: Negative.     Allergies  Bactrim [sulfamethoxazole-trimethoprim]  Home Medications   Prior to Admission medications   Medication Sig Start Date End Date Taking? Authorizing Provider  cephALEXin (KEFLEX) 500 MG capsule Take 1 capsule (500 mg total) by mouth 4 (four) times daily. 05/31/16   Deatra Canter, FNP  phenazopyridine (PYRIDIUM) 200 MG tablet Take 1 tablet (200 mg total) by mouth 3 (three) times daily. 05/31/16   Deatra Canter, FNP   Meds Ordered and Administered this Visit  Medications - No data to display  BP 141/91 (BP Location: Right Arm)   Pulse 85   Temp 98.4 F (36.9 C)  (Oral)   Resp 16   LMP 05/16/2016 (Exact Date)   SpO2 100%  No data found.   Physical Exam  Constitutional: She appears well-developed and well-nourished.  HENT:  Head: Normocephalic and atraumatic.  Eyes: Conjunctivae and EOM are normal. Pupils are equal, round, and reactive to light.  Neck: Normal range of motion. Neck supple.  Cardiovascular: Normal rate, regular rhythm and normal heart sounds.   Pulmonary/Chest: Effort normal and breath sounds normal.  Abdominal: Soft. Bowel sounds are normal.  Nursing note and vitals reviewed.   Urgent Care Course     Procedures (including critical care time)  Labs Review Labs Reviewed  POCT URINALYSIS DIP (DEVICE) - Abnormal; Notable for the following:       Result Value   Hgb urine dipstick LARGE (*)    Protein, ur >=300 (*)    Leukocytes, UA TRACE (*)    All other components within normal limits  URINE CULTURE    Imaging Review No results found.   Visual Acuity Review  Right Eye Distance:   Left Eye Distance:   Bilateral Distance:    Right Eye Near:   Left Eye Near:    Bilateral Near:         MDM   1. Urinary tract infection with hematuria, site unspecified   2. Dysuria    Keflex 500mg  one po qid x 5 days #20 Pyridium 100mg  po tid x 2d #6      Deatra Canter, FNP 05/31/16 1846    Anselm Pancoast  [place], FNP 05/31/16 [phone]

## 2016-06-03 LAB — URINE CULTURE: Culture: >=100000 — AB

## 2016-07-26 DIAGNOSIS — Z6837 Body mass index (BMI) 37.0-37.9, adult: Secondary | ICD-10-CM | POA: Diagnosis not present

## 2016-07-26 DIAGNOSIS — E669 Obesity, unspecified: Secondary | ICD-10-CM | POA: Diagnosis not present

## 2016-07-26 DIAGNOSIS — Z0000 Encounter for general adult medical examination without abnormal findings: Secondary | ICD-10-CM | POA: Diagnosis not present

## 2016-07-27 MED FILL — PHENTERMINE 37.5 MG TABLET: 37.5 | 30 days supply | Qty: 30 | Fill #0

## 2016-08-17 ENCOUNTER — Encounter (HOSPITAL_COMMUNITY): Payer: Self-pay | Admitting: Emergency Medicine

## 2016-08-17 ENCOUNTER — Ambulatory Visit (HOSPITAL_COMMUNITY)
Admission: EM | Admit: 2016-08-17 | Discharge: 2016-08-17 | Disposition: A | Discharge status: Home / Self Care | Payer: 59 | Attending: Family Medicine | Admitting: Family Medicine

## 2016-08-17 DIAGNOSIS — L02412 Cutaneous abscess of left axilla: Secondary | ICD-10-CM

## 2016-08-17 MED ORDER — LIDOCAINE-EPINEPHRINE (PF) 2 %-1:200000 IJ SOLN
INTRAMUSCULAR | Status: AC
  Filled 2016-08-17: qty 20

## 2016-08-17 NOTE — ED Triage Notes (Signed)
[facility] a [facility] abscess under her left arm x 6 days.

## 2016-08-17 NOTE — ED Provider Notes (Signed)
CSN: [id number]     Arrival date & time 08/17/16  1908 History   None    Chief Complaint  Patient presents with  . Abscess   (Consider location/radiation/quality/duration/timing/severity/associated sxs/prior Treatment) Patient c/o abscess under left axilla for a week.  She does have a rx for doxycycline.   The history is provided by the patient.  Abscess  Abscess location: left axilla. Abscess quality: draining, painful and redness   Red streaking: no   Duration:  6 days Progression:  Worsening Pain details:    Quality:  Pressure   Severity:  Moderate   Duration:  6 days   Timing:  Constant   Progression:  Worsening Chronicity:  New Relieved by:  Nothing   History reviewed. No pertinent past medical history. Past Surgical History:  Procedure Laterality Date  . WISDOM TOOTH EXTRACTION     History reviewed. No pertinent family history. Social History  Substance Use Topics  . Smoking status: Never Smoker  . Smokeless tobacco: Never Used  . Alcohol use Yes     Comment: occ   OB History    No data available     Review of Systems  Constitutional: Negative.   HENT: Negative.   Eyes: Negative.   Respiratory: Negative.   Cardiovascular: Negative.   Gastrointestinal: Negative.   Endocrine: Negative.   Genitourinary: Negative.   Musculoskeletal: Negative.   Skin: Positive for wound.  Allergic/Immunologic: Negative.   Neurological: Negative.   Hematological: Negative.   Psychiatric/Behavioral: Negative.     Allergies  Bactrim [sulfamethoxazole-trimethoprim]  Home Medications   Prior to Admission medications   Medication Sig Start Date End Date Taking? Authorizing Provider  doxycycline (DORYX) 100 MG EC tablet Take 100 mg by mouth 2 (two) times daily.   Yes [provider]  phentermine 37.5 MG capsule Take 37.5 mg by mouth every morning.   Yes [provider]   Meds Ordered and Administered this Visit  Medications - No data to  display  BP (!) 146/91 (BP Location: Right Arm)   Pulse 84   Temp 98.3 F (36.8 C) (Oral)   Resp 20   SpO2 98%  No data found.   Physical Exam  Constitutional: She is oriented to person, place, and time. She appears well-developed and well-nourished.  HENT:  Head: Normocephalic and atraumatic.  Eyes: Conjunctivae and EOM are normal. Pupils are equal, round, and reactive to light.  Neck: Normal range of motion. Neck supple.  Cardiovascular: Normal rate, regular rhythm and normal heart sounds.   Pulmonary/Chest: Effort normal and breath sounds normal.  Abdominal: Soft. Bowel sounds are normal.  Musculoskeletal: Normal range of motion.  Neurological: She is alert and oriented to person, place, and time.  Skin:  Left axilla with abscess.  Nursing note and vitals reviewed.   Urgent Care Course     .Marland Kitchen.Incision and Drainage Date/Time: 08/17/2016 8:13 PM Performed by: Deatra CanterXFORD, WILLIAM J Authorized by: Elvina SidleLAUENSTEIN, KURT   Consent:    Consent obtained:  Verbal   Consent given by:  Patient   Risks discussed:  Bleeding   Alternatives discussed:  No treatment Location:    Type:  Abscess   Size:  3 cm   Location:  Upper extremity   Upper extremity location: left axilla. Pre-procedure details:    Skin preparation:  Betadine Anesthesia (see MAR for exact dosages):    Anesthesia method:  Local infiltration   Local anesthetic:  Lidocaine 1% WITH epi Procedure type:    Complexity:  Simple  Procedure details:    Needle aspiration: no     Incision types:  Stab incision   Incision depth:  Submucosal   Scalpel blade:  11   Wound management:  Probed and deloculated   Drainage:  Bloody and purulent   Drainage amount:  Moderate   Wound treatment:  Wound left open   Packing materials:  None Post-procedure details:    Patient tolerance of procedure:  Tolerated well, no immediate complications   (including critical care time)  Labs Review Labs Reviewed - No data to  display  Imaging Review No results found.   Visual Acuity Review  Right Eye Distance:   Left Eye Distance:   Bilateral Distance:    Right Eye Near:   Left Eye Near:    Bilateral Near:         MDM   1. Abscess of axilla, left    Continue doxycycline 100mg  po bid for next 10 days  Change dressing daily      Deatra Canter, [place] 08/17/16 2015

## 2016-08-31 MED FILL — PHENTERMINE 37.5 MG TABLET: 37.5 | 30 days supply | Qty: 30 | Fill #1

## 2016-10-05 DIAGNOSIS — F4321 Adjustment disorder with depressed mood: Secondary | ICD-10-CM | POA: Diagnosis not present

## 2016-10-05 MED FILL — PHENTERMINE 37.5 MG TABLET: 37.5 | 30 days supply | Qty: 30 | Fill #2

## 2016-11-01 DIAGNOSIS — F4321 Adjustment disorder with depressed mood: Secondary | ICD-10-CM | POA: Diagnosis not present

## 2016-11-03 DIAGNOSIS — E668 Other obesity: Secondary | ICD-10-CM | POA: Diagnosis not present

## 2016-11-03 DIAGNOSIS — Z6835 Body mass index (BMI) 35.0-35.9, adult: Secondary | ICD-10-CM | POA: Diagnosis not present

## 2016-11-03 DIAGNOSIS — Z79899 Other long term (current) drug therapy: Secondary | ICD-10-CM | POA: Diagnosis not present

## 2016-11-10 MED FILL — PHENTERMINE 37.5 MG TABLET: 37.5 | 30 days supply | Qty: 30 | Fill #0

## 2016-11-15 DIAGNOSIS — F4321 Adjustment disorder with depressed mood: Secondary | ICD-10-CM | POA: Diagnosis not present

## 2016-11-24 DIAGNOSIS — Z01419 Encounter for gynecological examination (general) (routine) without abnormal findings: Secondary | ICD-10-CM | POA: Diagnosis not present

## 2016-11-24 DIAGNOSIS — Z6835 Body mass index (BMI) 35.0-35.9, adult: Secondary | ICD-10-CM | POA: Diagnosis not present

## 2016-11-24 DIAGNOSIS — Z113 Encounter for screening for infections with a predominantly sexual mode of transmission: Secondary | ICD-10-CM | POA: Diagnosis not present

## 2016-11-24 DIAGNOSIS — N760 Acute vaginitis: Secondary | ICD-10-CM | POA: Diagnosis not present

## 2016-11-25 MED FILL — metroNIDAZOLE 500 MG TABS: 500 | 7 days supply | Qty: 14 | Fill #0

## 2016-12-07 DIAGNOSIS — R87610 Atypical squamous cells of undetermined significance on cytologic smear of cervix (ASC-US): Secondary | ICD-10-CM | POA: Diagnosis not present

## 2016-12-07 DIAGNOSIS — N870 Mild cervical dysplasia: Secondary | ICD-10-CM | POA: Diagnosis not present

## 2016-12-07 DIAGNOSIS — Z3202 Encounter for pregnancy test, result negative: Secondary | ICD-10-CM | POA: Diagnosis not present

## 2016-12-08 DIAGNOSIS — F4321 Adjustment disorder with depressed mood: Secondary | ICD-10-CM | POA: Diagnosis not present

## 2016-12-09 DIAGNOSIS — R1084 Generalized abdominal pain: Secondary | ICD-10-CM | POA: Diagnosis not present

## 2016-12-09 DIAGNOSIS — N938 Other specified abnormal uterine and vaginal bleeding: Secondary | ICD-10-CM | POA: Diagnosis not present

## 2016-12-09 DIAGNOSIS — Z79899 Other long term (current) drug therapy: Secondary | ICD-10-CM | POA: Diagnosis not present

## 2016-12-09 DIAGNOSIS — K219 Gastro-esophageal reflux disease without esophagitis: Secondary | ICD-10-CM | POA: Diagnosis not present

## 2016-12-09 MED FILL — FLUCONAZOLE 150 MG TABLET: 150 | 1 days supply | Qty: 1 | Fill #0

## 2016-12-14 ENCOUNTER — Encounter: Payer: Self-pay | Admitting: Genetic Counselor

## 2016-12-15 DIAGNOSIS — N870 Mild cervical dysplasia: Secondary | ICD-10-CM | POA: Diagnosis not present

## 2016-12-15 DIAGNOSIS — Z3202 Encounter for pregnancy test, result negative: Secondary | ICD-10-CM | POA: Diagnosis not present

## 2016-12-15 DIAGNOSIS — A630 Anogenital (venereal) warts: Secondary | ICD-10-CM | POA: Diagnosis not present

## 2016-12-21 DIAGNOSIS — F4321 Adjustment disorder with depressed mood: Secondary | ICD-10-CM | POA: Diagnosis not present

## 2017-01-10 DIAGNOSIS — F4321 Adjustment disorder with depressed mood: Secondary | ICD-10-CM | POA: Diagnosis not present

## 2017-01-12 ENCOUNTER — Other Ambulatory Visit: Payer: 59

## 2017-01-12 ENCOUNTER — Encounter: Payer: Self-pay | Admitting: Genetic Counselor

## 2017-01-12 ENCOUNTER — Ambulatory Visit (HOSPITAL_BASED_OUTPATIENT_CLINIC_OR_DEPARTMENT_OTHER): Payer: 59 | Admitting: Genetic Counselor

## 2017-01-12 ENCOUNTER — Other Ambulatory Visit: Payer: Self-pay

## 2017-01-12 DIAGNOSIS — Z803 Family history of malignant neoplasm of breast: Secondary | ICD-10-CM | POA: Diagnosis not present

## 2017-01-12 DIAGNOSIS — Z8042 Family history of malignant neoplasm of prostate: Secondary | ICD-10-CM | POA: Diagnosis not present

## 2017-01-12 MED FILL — PHENTERMINE 37.5 MG TABLET: 37.5 | 30 days supply | Qty: 30 | Fill #1

## 2017-01-12 NOTE — Progress Notes (Signed)
REFERRING PROVIDER: Tyson Dense, MD [address] [place], [place] [address]  PRIMARY PROVIDER:  Nanci Pina, FNP  PRIMARY REASON FOR VISIT:  1. Family history of breast cancer   2. Family history of prostate cancer      HISTORY OF PRESENT ILLNESS:   Paula Lawrence, a 26 y.o. female, was seen for a [place] cancer genetics consultation at the request of Dr. Royston Lawrence due to a family history of cancer.  Paula Lawrence presents to clinic today to discuss the possibility of a hereditary predisposition to cancer, genetic testing, and to further clarify her future cancer risks, as well as potential cancer risks for family members. Paula Lawrence is a 26 y.o. female with no personal history of cancer.  She is concerned about her family history of breast cancer.  CANCER HISTORY:   No history exists.     HORMONAL RISK FACTORS:  Menarche was at age 68.  First live birth at age N/A.  OCP use for approximately 5 years.  Ovaries intact: yes.  Hysterectomy: no.  Menopausal status: premenopausal.  HRT use: 0 years. Colonoscopy: no; not examined. Mammogram within the last year: no. Number of breast biopsies: 0. Up to date with pelvic exams:  yes. Any excessive radiation exposure in the past:  no  Past Medical History:  Diagnosis Date  . Family history of breast cancer   . Family history of prostate cancer     Past Surgical History:  Procedure Laterality Date  . WISDOM TOOTH EXTRACTION      Social History   Social History  . Marital status: Single    Spouse name: N/A  . Number of children: N/A  . Years of education: N/A   Social History Main Topics  . Smoking status: Never Smoker  . Smokeless tobacco: Never Used  . Alcohol use Yes     Comment: occ  . Drug use: No  . Sexual activity: Not Asked   Other Topics Concern  . None   Social History Narrative  . None     FAMILY HISTORY:  We obtained a detailed, 4-generation family history.   Significant diagnoses are listed below: Family History  Problem Relation Age of Onset  . Breast cancer Mother 53  . Hypertension Maternal Grandmother   . Kidney disease Maternal Grandmother   . Congestive Heart Failure Paternal Grandfather   . Prostate cancer Other        MGF's brother  . Breast cancer Cousin        mother's paternal first cousin    The patient does not have children.  She has four maternal half siblings - one brother and three sisters, and two paternal half brothers.  All are cancer free.  The patient's father is alive and cancer free, her mother was diagnosed with breast cancer at 33.  It is unknown whether she underwent genetic testing.  The patient's mother has one full sister and a paternal half brother and sister, who are all cancer free.  Her grandmother is deceased due to kidney disease, her grandfather is alive in his 6's.  Her grandfather has a brother who has prostate cancer and has a daughter with breast cancer.  The patient's father had a full brother and a maternal half brother who are cancer free.  His father died of congestive heart failure, and his mother is alive and well.  His mother reportedly has family members with ovarian cancer, but the patient is not sure how  they are related to her.  Paula Lawrence is unaware of previous family history of genetic testing for hereditary cancer risks. Patient's maternal ancestors are of [place] and [place] descent, and paternal ancestors are of [place] American and Cherokee descent. There is no reported Ashkenazi Jewish ancestry. There is no known consanguinity.  GENETIC COUNSELING ASSESSMENT: Paula Lawrence is a 26 y.o. female with a family history of breast and prostate cancer which is somewhat suggestive of a hereditary cancer syndrome and predisposition to cancer. We, therefore, discussed and recommended the following at today's visit.   DISCUSSION: We discussed that about 5-10% of breast  cancer is hereditary with most cases due to BRCA mutations.  There are other genes associated with hereditary breast cancer risk, including ATM, CHEK2 and PALB2.  We reviewed the characteristics, features and inheritance patterns of hereditary cancer syndromes. We also discussed genetic testing, including the appropriate family members to test, the process of testing, insurance coverage and turn-around-time for results. Based on her mother's early onset of breast cancer, the patient's mother is the best person in the family to test.  We reviewed why her mother is better to test, and how if we test the patient and she is negative, that this will not lower her risk too much, until we understand what is happening in her mother.  We discussed the implications of a negative, positive and/or variant of uncertain significant result. We recommended Paula Lawrence pursue genetic testing for the common hereditary cancer gene panel. The Hereditary Gene Panel offered by [facility] includes sequencing and/or deletion duplication testing of the following 47 genes: APC, ATM, AXIN2, BARD1, BMPR1A, BRCA1, BRCA2, BRIP1, CDH1, CDK4, CDKN2A (p14ARF), CDKN2A (p16INK4a), CHEK2, CTNNA1, DICER1, EPCAM (Deletion/duplication testing only), GREM1 (promoter region deletion/duplication testing only), KIT, MEN1, MLH1, MSH2, MSH3, MSH6, MUTYH, NBN, NF1, NHTL1, PALB2, PDGFRA, PMS2, POLD1, POLE, PTEN, RAD50, RAD51C, RAD51D, SDHB, SDHC, SDHD, SMAD4, SMARCA4. STK11, TP53, TSC1, TSC2, and VHL.  The following genes were evaluated for sequence changes only: SDHA and HOXB13 c.251G>A variant only.   Based on Paula Lawrence's family history of cancer, she meets medical criteria for genetic testing. Despite that she meets criteria, she may still have an out of pocket cost. We discussed that if her out of pocket cost for testing is over $100, the laboratory will call and confirm whether she wants to proceed with testing.  If the out of pocket cost of testing  is less than $100 she will be billed by the genetic testing laboratory.   In order to estimate her chance of having a BRCA mutation, we used statistical models (Tyrer Cusik and [place]) and laboratory data that take into account her personal medical history, family history and ancestry.  Because each model is different, there can be a lot of variability in the risks they give.  Therefore, these numbers must be considered a rough range and not a precise risk of having a BRCA mutation.  These models estimate that she has approximately a 1.1-6% chance of having a mutation.   Based on the patient's personal and family history, statistical models (Tyrer Cusik)  and literature data were used to estimate her risk of developing breast cancer. This estimates her lifetime risk of developing breast cancer to be approximately 26.3%. This estimation does not take into account any genetic testing results.  The patient's lifetime breast cancer risk is a preliminary estimate based on available information using one of several models endorsed by the [place] (ACS).  The ACS recommends consideration of breast MRI screening as an adjunct to mammography for patients at high risk (defined as 20% or greater lifetime risk). A more detailed breast cancer risk assessment can be considered, if clinically indicated.   Paula Lawrence has been determined to be at high risk for breast cancer.  Therefore, we recommend that annual screening with mammography and breast MRI begin at age 99, or 10 years prior to the age of breast cancer diagnosis in a relative (whichever is earlier).         PLAN: After considering the risks, benefits, and limitations, Paula Lawrence  provided informed consent to pursue genetic testing and the blood sample was sent to [facility] for analysis of the common hereditary cancer panel. Results should be available within approximately 2-3 weeks' time, at which point they will be disclosed  by telephone to Paula Lawrence, as will any additional recommendations warranted by these results. Paula Lawrence will receive a summary of her genetic counseling visit and a copy of her results once available. This information will also be available in Epic. We encouraged Paula Lawrence to remain in contact with cancer genetics annually so that we can continuously update the family history and inform her of any changes in cancer genetics and testing that may be of benefit for her family. Paula Lawrence questions were answered to her satisfaction today. Our contact information was provided should additional questions or concerns arise.  Based on Paula Lawrence's family history, we recommended her mother, who was diagnosed with breast cancer at age 22, have genetic counseling and testing. Paula Lawrence will let us know if we can be of any assistance in coordinating genetic counseling and/or testing for this family member.   Lastly, we encouraged Paula Lawrence to remain in contact with cancer genetics annually so that we can continuously update the family history and inform her of any changes in cancer genetics and testing that may be of benefit for this family.   Ms.  Lawrence questions were answered to her satisfaction today. Our contact information was provided should additional questions or concerns arise. Thank you for the referral and allowing [place] to share in the care of your patient.   Karen P. Florene Glen, [place], [facility] Certified Genetic Counselor [email or web address] phone: [phone]  The patient was seen for a total of 35 minutes in face-to-face genetic counseling.  This patient was discussed with Drs. Magrinat, Lindi Adie and/or Burr Medico who agrees with the above.    _______________________________________________________________________ For Office Staff:  Number of people involved in session: 1 Was an Intern/ student involved with case: no

## 2017-01-21 ENCOUNTER — Encounter: Payer: Self-pay | Admitting: Genetic Counselor

## 2017-01-21 ENCOUNTER — Telehealth: Payer: Self-pay | Admitting: Genetic Counselor

## 2017-01-21 ENCOUNTER — Ambulatory Visit: Payer: Self-pay | Admitting: Genetic Counselor

## 2017-01-21 DIAGNOSIS — Z803 Family history of malignant neoplasm of breast: Secondary | ICD-10-CM

## 2017-01-21 DIAGNOSIS — Z1379 Encounter for other screening for genetic and chromosomal anomalies: Secondary | ICD-10-CM | POA: Insufficient documentation

## 2017-01-21 DIAGNOSIS — Z8042 Family history of malignant neoplasm of prostate: Secondary | ICD-10-CM

## 2017-01-21 NOTE — Progress Notes (Signed)
HPI: Ms. Schlie was previously seen in the [place] clinic due to a family history of cancer and concerns regarding a hereditary predisposition to cancer. Please refer to our prior cancer genetics clinic note for more information regarding Ms. Bekker's medical, social and family histories, and our assessment and recommendations, at the time. Ms. Zipp recent genetic test results were disclosed to her, as were recommendations warranted by these results. These results and recommendations are discussed in more detail below.  CANCER HISTORY:   No history exists.    FAMILY HISTORY:  We obtained a detailed, 4-generation family history.  Significant diagnoses are listed below: Family History  Problem Relation Age of Onset  . Breast cancer Mother 75  . Hypertension Maternal Grandmother   . Kidney disease Maternal Grandmother   . Congestive Heart Failure Paternal Grandfather   . Prostate cancer Other        MGF's brother  . Breast cancer Cousin        mother's paternal first cousin    The patient does not have children.  She has four maternal half siblings - one brother and three sisters, and two paternal half brothers.  All are cancer free.  The patient's father is alive and cancer free, her mother was diagnosed with breast cancer at 50.  It is unknown whether she underwent genetic testing.  The patient's mother has one full sister and a paternal half brother and sister, who are all cancer free.  Her grandmother is deceased due to kidney disease, her grandfather is alive in his 46's.  Her grandfather has a brother who has prostate cancer and has a daughter with breast cancer.  The patient's father had a full brother and a maternal half brother who are cancer free.  His father died of congestive heart failure, and his mother is alive and well.  His mother reportedly has family members with ovarian cancer, but the patient is not sure how they are related to  her.  Ms. Heal is unaware of previous family history of genetic testing for hereditary cancer risks. Patient's maternal ancestors are of [place] and [place] descent, and paternal ancestors are of [place] American and [place] descent. There is no reported Ashkenazi Jewish ancestry. There is no known consanguinity.  GENETIC TEST RESULTS: Genetic testing reported out on January 18, 2017 through the common hereditary cancer panel found no deleterious mutations.  The Hereditary Gene Panel offered by [facility] includes sequencing and/or deletion duplication testing of the following 47 genes: APC, ATM, AXIN2, BARD1, BMPR1A, BRCA1, BRCA2, BRIP1, CDH1, CDK4, CDKN2A (p14ARF), CDKN2A (p16INK4a), CHEK2, CTNNA1, DICER1, EPCAM (Deletion/duplication testing only), GREM1 (promoter region deletion/duplication testing only), KIT, MEN1, MLH1, MSH2, MSH3, MSH6, MUTYH, NBN, NF1, NHTL1, PALB2, PDGFRA, PMS2, POLD1, POLE, PTEN, RAD50, RAD51C, RAD51D, SDHB, SDHC, SDHD, SMAD4, SMARCA4. STK11, TP53, TSC1, TSC2, and VHL.  The following genes were evaluated for sequence changes only: SDHA and HOXB13 c.251G>A variant only. The test report has been scanned into EPIC and is located under the Molecular Pathology section of the Results Review tab.   We discussed with Ms. Debow that since the current genetic testing is not perfect, it is possible there may be a gene mutation in one of these genes that current testing cannot detect, but that chance is small. We also discussed, that it is possible that another gene that has not yet been discovered, or that we have not yet tested, is responsible for the cancer diagnoses in the family, and it  is, therefore, important to remain in touch with cancer genetics in the future so that we can continue to offer Ms. Turek the most up to date genetic testing.      CANCER SCREENING RECOMMENDATIONS:  This normal result is reassuring and indicates that Ms. Jocson does  not likely have an increased risk of cancer due to a mutation in one of these genes.  We, therefore, recommended  Ms. Boudreau continue to follow the cancer screening guidelines provided by her primary healthcare providers.   Based on the Ms. Gipe's family history of cancer, as well as her genetic test results, statistical models (Tyrer Cusik)  and literature data were used to estimate her risk of developing breast cancer. These estimate her lifetime risk of developing breast cancer to be approximately 25.8%.  The patient's lifetime breast cancer risk is a preliminary estimate based on available information using one of several models endorsed by the [place] (ACS). The ACS recommends consideration of breast MRI screening as an adjunct to mammography for patients at high risk (defined as 20% or greater lifetime risk). A more detailed breast cancer risk assessment can be considered, if clinically indicated.   Ms. Wrubel has been determined to be at high risk for breast cancer.  Therefore, we recommend that annual screening with mammography and breast MRI begin at age 43, or 10 years prior to the age of breast cancer diagnosis in a relative (whichever is earlier).  We discussed that Ms. Murri should discuss her individual situation with her referring physician and determine a breast cancer screening plan with which they are both comfortable.        RECOMMENDATIONS FOR FAMILY MEMBERS: Women in this family might be at some increased risk of developing cancer, over the general population risk, simply due to the family history of cancer. We recommended women in this family have a yearly mammogram beginning at age 82, or 41 years younger than the earliest onset of cancer, an annual clinical breast exam, and perform monthly breast self-exams. Women in this family should also have a gynecological exam as recommended by their primary provider. All family members should have a  colonoscopy by age 59.  Based on Ms. Pinkley's family history, we recommended her mother, who was diagnosed with breast cancer at age 58, have genetic counseling and testing. Ms. Kiene will let us know if we can be of any assistance in coordinating genetic counseling and/or testing for this family member.   FOLLOW-UP: Lastly, we discussed with Ms. Disbro that cancer genetics is a rapidly advancing field and it is possible that new genetic tests will be appropriate for her and/or her family members in the future. We encouraged her to remain in contact with cancer genetics on an annual basis so we can update her personal and family histories and let her know of advances in cancer genetics that may benefit this family.   Our contact number was provided. Ms. Hew questions were answered to her satisfaction, and she knows she is welcome to call us at anytime with additional questions or concerns.   Roma Kayser, MS, [facility] Certified Genetic Counselor [email or web address]

## 2017-01-21 NOTE — Telephone Encounter (Signed)
Revealed negative genetic testing.  Discussed that we do not know why there is cancer in the family. It could be due to a different gene that we are not testing, or maybe our current technology may not be able to pick something up.  It will be important for her to keep in contact with genetics to keep up with whether additional testing may be needed.  

## 2017-01-26 DIAGNOSIS — F4321 Adjustment disorder with depressed mood: Secondary | ICD-10-CM | POA: Diagnosis not present

## 2017-02-10 DIAGNOSIS — F4321 Adjustment disorder with depressed mood: Secondary | ICD-10-CM | POA: Diagnosis not present

## 2017-02-24 DIAGNOSIS — F4321 Adjustment disorder with depressed mood: Secondary | ICD-10-CM | POA: Diagnosis not present

## 2017-03-14 DIAGNOSIS — Z6834 Body mass index (BMI) 34.0-34.9, adult: Secondary | ICD-10-CM | POA: Diagnosis not present

## 2017-03-14 DIAGNOSIS — Z79899 Other long term (current) drug therapy: Secondary | ICD-10-CM | POA: Diagnosis not present

## 2017-03-14 DIAGNOSIS — E663 Overweight: Secondary | ICD-10-CM | POA: Diagnosis not present

## 2017-03-14 DIAGNOSIS — Z713 Dietary counseling and surveillance: Secondary | ICD-10-CM | POA: Diagnosis not present

## 2017-03-14 DIAGNOSIS — E6609 Other obesity due to excess calories: Secondary | ICD-10-CM | POA: Diagnosis not present

## 2017-03-16 MED FILL — PHENTERMINE 37.5 MG TABLET: 37.5 | 30 days supply | Qty: 30 | Fill #2

## 2017-06-06 MED FILL — PHENTERMINE 37.5 MG TABLET: 37.5 | 30 days supply | Qty: 30 | Fill #0

## 2017-06-07 DIAGNOSIS — N939 Abnormal uterine and vaginal bleeding, unspecified: Secondary | ICD-10-CM | POA: Diagnosis not present

## 2017-06-07 DIAGNOSIS — N926 Irregular menstruation, unspecified: Secondary | ICD-10-CM | POA: Diagnosis not present

## 2017-06-07 DIAGNOSIS — Z3040 Encounter for surveillance of contraceptives, unspecified: Secondary | ICD-10-CM | POA: Diagnosis not present

## 2017-06-07 MED FILL — KELNOR 1-35 28 TABLET: 1-35 | 9 days supply | Qty: 28 | Fill #0

## 2017-06-09 DIAGNOSIS — F4321 Adjustment disorder with depressed mood: Secondary | ICD-10-CM | POA: Diagnosis not present

## 2017-06-15 DIAGNOSIS — Z113 Encounter for screening for infections with a predominantly sexual mode of transmission: Secondary | ICD-10-CM | POA: Diagnosis not present

## 2017-06-15 DIAGNOSIS — N926 Irregular menstruation, unspecified: Secondary | ICD-10-CM | POA: Diagnosis not present

## 2017-06-23 DIAGNOSIS — F4321 Adjustment disorder with depressed mood: Secondary | ICD-10-CM | POA: Diagnosis not present

## 2017-07-12 DIAGNOSIS — F4321 Adjustment disorder with depressed mood: Secondary | ICD-10-CM | POA: Diagnosis not present

## 2017-07-27 DIAGNOSIS — F4321 Adjustment disorder with depressed mood: Secondary | ICD-10-CM | POA: Diagnosis not present

## 2017-07-29 MED FILL — PHENTERMINE 37.5 MG TABLET: 37.5 | 30 days supply | Qty: 30 | Fill #1

## 2017-08-10 DIAGNOSIS — F4321 Adjustment disorder with depressed mood: Secondary | ICD-10-CM | POA: Diagnosis not present

## 2017-08-25 DIAGNOSIS — E669 Obesity, unspecified: Secondary | ICD-10-CM | POA: Diagnosis not present

## 2017-08-25 DIAGNOSIS — Z6833 Body mass index (BMI) 33.0-33.9, adult: Secondary | ICD-10-CM | POA: Diagnosis not present

## 2017-08-25 DIAGNOSIS — R030 Elevated blood-pressure reading, without diagnosis of hypertension: Secondary | ICD-10-CM | POA: Diagnosis not present

## 2017-08-25 DIAGNOSIS — F4321 Adjustment disorder with depressed mood: Secondary | ICD-10-CM | POA: Diagnosis not present

## 2017-08-25 DIAGNOSIS — Z3040 Encounter for surveillance of contraceptives, unspecified: Secondary | ICD-10-CM | POA: Diagnosis not present

## 2017-08-25 DIAGNOSIS — G441 Vascular headache, not elsewhere classified: Secondary | ICD-10-CM | POA: Diagnosis not present

## 2017-08-25 DIAGNOSIS — R87610 Atypical squamous cells of undetermined significance on cytologic smear of cervix (ASC-US): Secondary | ICD-10-CM | POA: Diagnosis not present

## 2017-08-25 DIAGNOSIS — R87619 Unspecified abnormal cytological findings in specimens from cervix uteri: Secondary | ICD-10-CM | POA: Diagnosis not present

## 2017-08-25 DIAGNOSIS — Z114 Encounter for screening for human immunodeficiency virus [HIV]: Secondary | ICD-10-CM | POA: Diagnosis not present

## 2017-08-25 DIAGNOSIS — Z79899 Other long term (current) drug therapy: Secondary | ICD-10-CM | POA: Diagnosis not present

## 2017-08-25 DIAGNOSIS — Z01419 Encounter for gynecological examination (general) (routine) without abnormal findings: Secondary | ICD-10-CM | POA: Diagnosis not present

## 2017-08-25 DIAGNOSIS — Z0000 Encounter for general adult medical examination without abnormal findings: Secondary | ICD-10-CM | POA: Diagnosis not present

## 2017-08-25 DIAGNOSIS — I10 Essential (primary) hypertension: Secondary | ICD-10-CM | POA: Diagnosis not present

## 2017-08-25 DIAGNOSIS — Z113 Encounter for screening for infections with a predominantly sexual mode of transmission: Secondary | ICD-10-CM | POA: Diagnosis not present

## 2017-08-26 MED FILL — LEVONOR-ETH ESTRAD 0.1-0.02: 0.1-20 | 84 days supply | Qty: 84 | Fill #0

## 2017-09-10 IMAGING — DX DG CHEST 2V
2 series · 2 of 2 positions shown · non-contrast
Comparison: None.

CLINICAL DATA: Cough with shortness of breath

EXAM:
CHEST  2 VIEW

[chest pa]
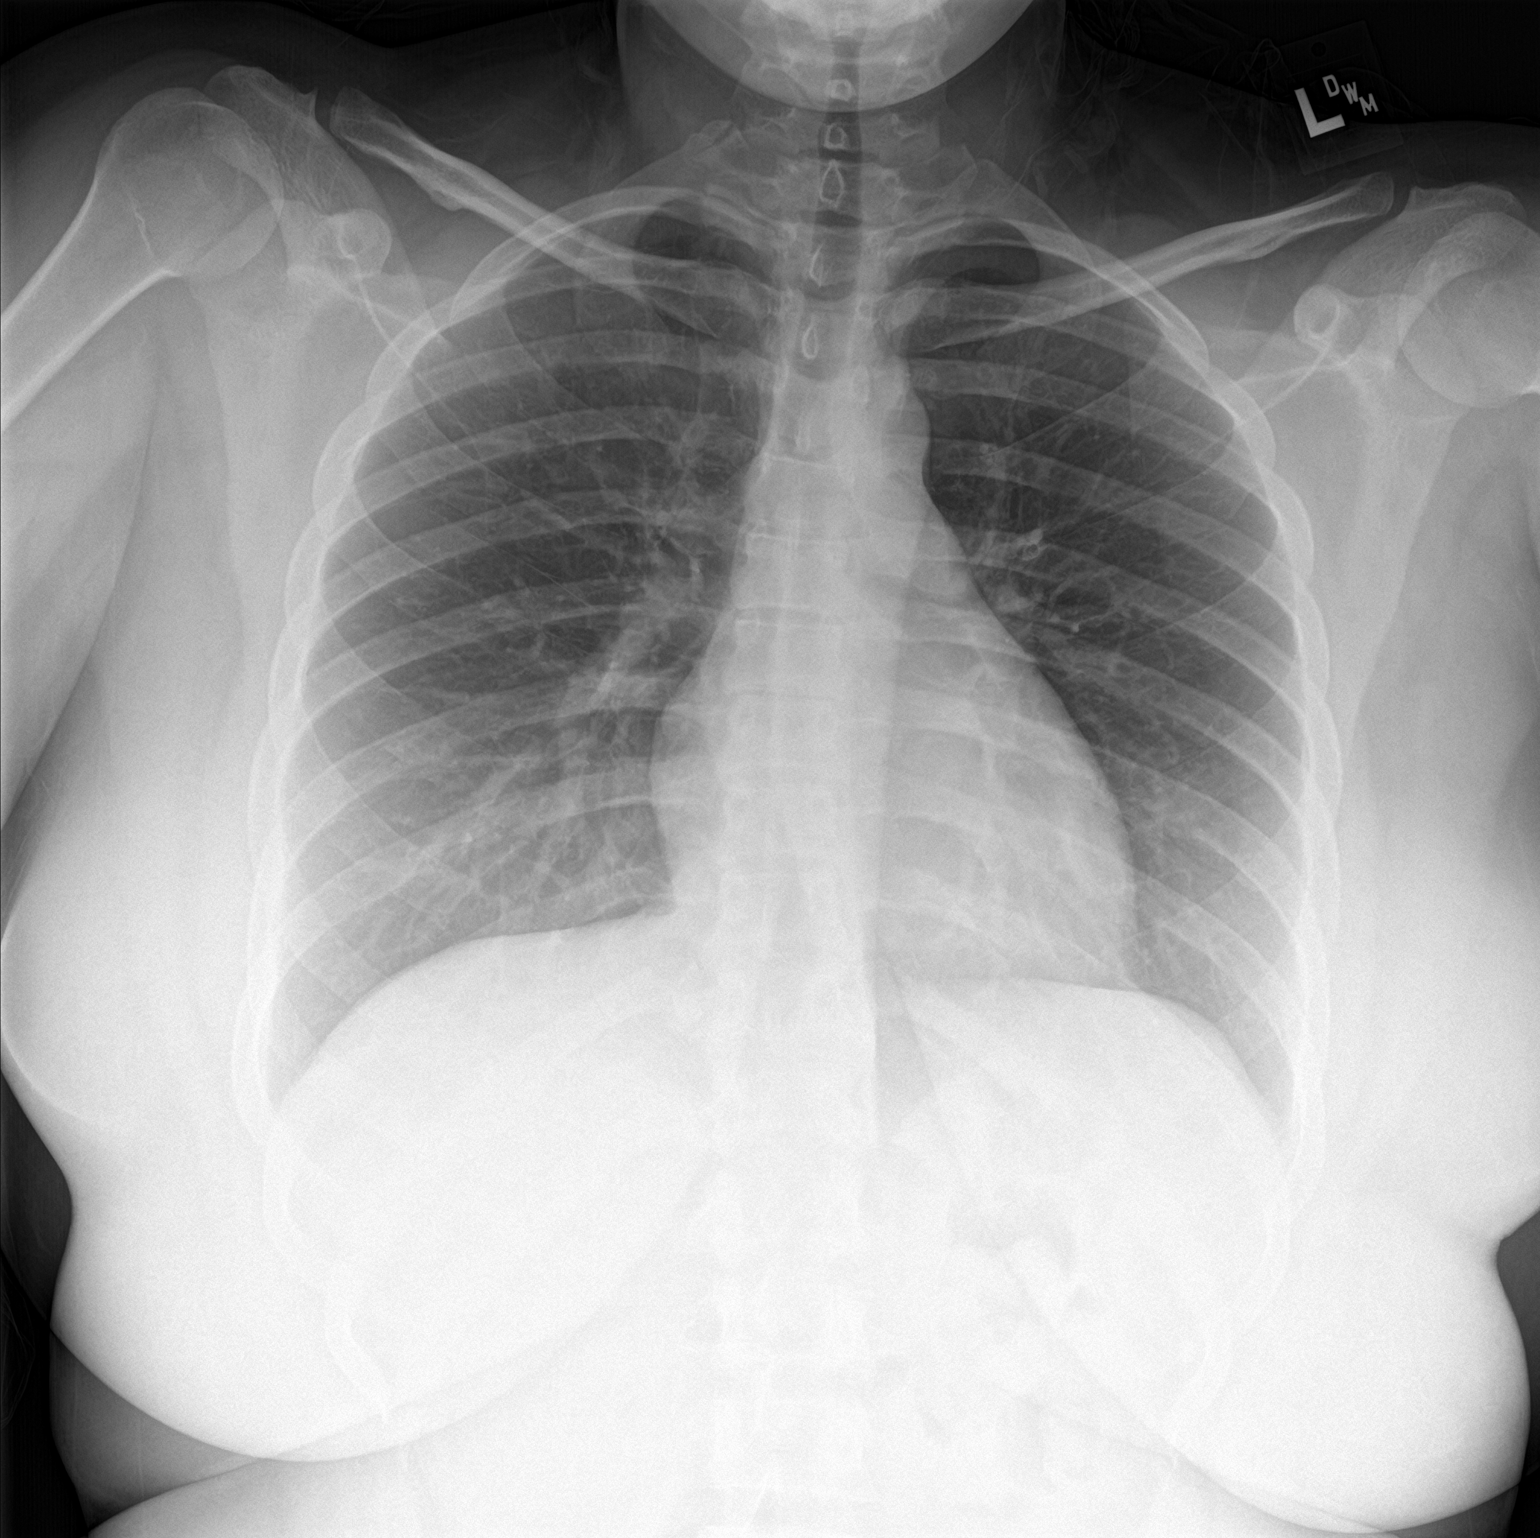

[chest lat]
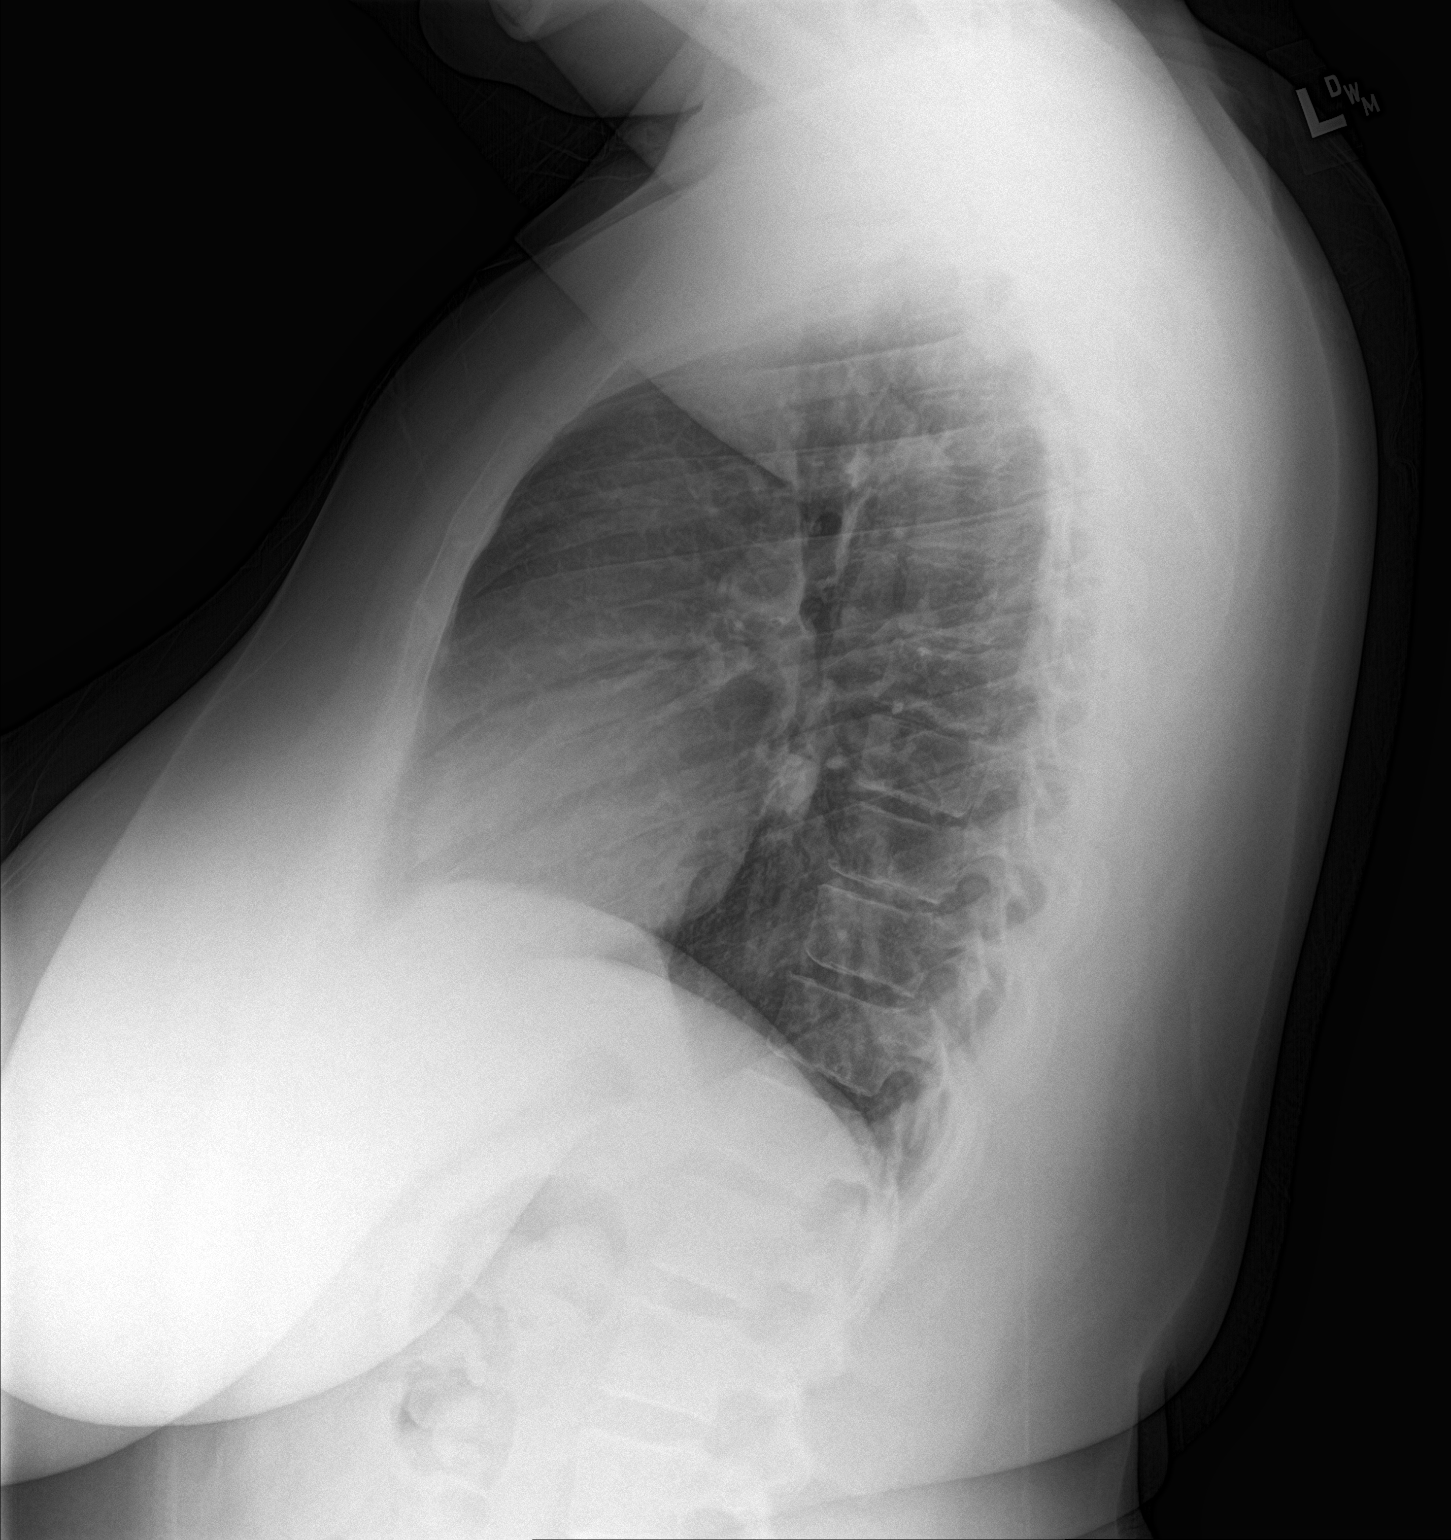

[2 of 2 positions shown; findings below may reference images not displayed]

FINDINGS: The heart size and mediastinal contours are within normal limits.
Both lungs are clear. The visualized skeletal structures are
unremarkable.
IMPRESSION: No active cardiopulmonary disease.

## 2017-09-13 DIAGNOSIS — F4321 Adjustment disorder with depressed mood: Secondary | ICD-10-CM | POA: Diagnosis not present

## 2017-09-23 DIAGNOSIS — Z113 Encounter for screening for infections with a predominantly sexual mode of transmission: Secondary | ICD-10-CM | POA: Diagnosis not present

## 2017-09-23 DIAGNOSIS — B373 Candidiasis of vulva and vagina: Secondary | ICD-10-CM | POA: Diagnosis not present

## 2017-09-23 DIAGNOSIS — N898 Other specified noninflammatory disorders of vagina: Secondary | ICD-10-CM | POA: Diagnosis not present

## 2017-09-23 MED FILL — FLUCONAZOLE 150 MG TABS: 150 | 1 days supply | Qty: 1 | Fill #0

## 2017-09-23 MED FILL — metroNIDAZOLE 500 MG TABS: 500 | 7 days supply | Qty: 14 | Fill #0

## 2017-10-03 DIAGNOSIS — Z3040 Encounter for surveillance of contraceptives, unspecified: Secondary | ICD-10-CM | POA: Diagnosis not present

## 2017-10-03 DIAGNOSIS — N926 Irregular menstruation, unspecified: Secondary | ICD-10-CM | POA: Diagnosis not present

## 2017-10-05 DIAGNOSIS — F4321 Adjustment disorder with depressed mood: Secondary | ICD-10-CM | POA: Diagnosis not present

## 2017-10-17 MED FILL — PHENTERMINE 37.5 MG TABLET: 37.5 | 30 days supply | Qty: 30 | Fill #2

## 2017-10-19 DIAGNOSIS — F4321 Adjustment disorder with depressed mood: Secondary | ICD-10-CM | POA: Diagnosis not present

## 2017-11-02 DIAGNOSIS — F4321 Adjustment disorder with depressed mood: Secondary | ICD-10-CM | POA: Diagnosis not present

## 2017-11-11 MED FILL — LEVONOR-ETH ESTRAD 0.1-0.02: 0.1-20 | 84 days supply | Qty: 84 | Fill #1

## 2017-11-14 DIAGNOSIS — Z3041 Encounter for surveillance of contraceptive pills: Secondary | ICD-10-CM | POA: Diagnosis not present

## 2017-11-14 DIAGNOSIS — Z8619 Personal history of other infectious and parasitic diseases: Secondary | ICD-10-CM | POA: Diagnosis not present

## 2017-11-14 DIAGNOSIS — R030 Elevated blood-pressure reading, without diagnosis of hypertension: Secondary | ICD-10-CM | POA: Diagnosis not present

## 2017-11-14 DIAGNOSIS — R21 Rash and other nonspecific skin eruption: Secondary | ICD-10-CM | POA: Diagnosis not present

## 2017-11-14 DIAGNOSIS — R87619 Unspecified abnormal cytological findings in specimens from cervix uteri: Secondary | ICD-10-CM | POA: Diagnosis not present

## 2017-11-14 DIAGNOSIS — R87610 Atypical squamous cells of undetermined significance on cytologic smear of cervix (ASC-US): Secondary | ICD-10-CM | POA: Diagnosis not present

## 2017-11-14 MED FILL — NYSTATIN 100,000 UNITS/GM O: 100000 | 15 days supply | Qty: 30 | Fill #0

## 2017-11-16 DIAGNOSIS — F4321 Adjustment disorder with depressed mood: Secondary | ICD-10-CM | POA: Diagnosis not present

## 2017-11-17 DIAGNOSIS — Z9189 Other specified personal risk factors, not elsewhere classified: Secondary | ICD-10-CM | POA: Diagnosis not present

## 2017-11-30 DIAGNOSIS — F4321 Adjustment disorder with depressed mood: Secondary | ICD-10-CM | POA: Diagnosis not present

## 2017-12-06 DIAGNOSIS — R21 Rash and other nonspecific skin eruption: Secondary | ICD-10-CM | POA: Diagnosis not present

## 2017-12-06 DIAGNOSIS — R87610 Atypical squamous cells of undetermined significance on cytologic smear of cervix (ASC-US): Secondary | ICD-10-CM | POA: Diagnosis not present

## 2017-12-06 DIAGNOSIS — N760 Acute vaginitis: Secondary | ICD-10-CM | POA: Diagnosis not present

## 2017-12-06 MED FILL — PHENTERMINE 37.5 MG TABLET: 37.5 | 30 days supply | Qty: 30 | Fill #0

## 2017-12-07 MED FILL — NYSTATIN 100,000 UNITS/GM O: 100000 | 15 days supply | Qty: 30 | Fill #1

## 2017-12-07 MED FILL — metroNIDAZOLE 500 MG TABS: 500 | 7 days supply | Qty: 14 | Fill #0

## 2017-12-14 DIAGNOSIS — F4321 Adjustment disorder with depressed mood: Secondary | ICD-10-CM | POA: Diagnosis not present

## 2017-12-23 DIAGNOSIS — H5213 Myopia, bilateral: Secondary | ICD-10-CM | POA: Diagnosis not present

## 2017-12-23 DIAGNOSIS — H52221 Regular astigmatism, right eye: Secondary | ICD-10-CM | POA: Diagnosis not present

## 2017-12-28 DIAGNOSIS — F4321 Adjustment disorder with depressed mood: Secondary | ICD-10-CM | POA: Diagnosis not present

## 2018-01-12 DIAGNOSIS — F4321 Adjustment disorder with depressed mood: Secondary | ICD-10-CM | POA: Diagnosis not present

## 2018-01-16 MED FILL — NYSTATIN 100,000 UNITS/GM O: 100000 | 20 days supply | Qty: 30 | Fill #0

## 2018-01-19 DIAGNOSIS — F17200 Nicotine dependence, unspecified, uncomplicated: Secondary | ICD-10-CM | POA: Diagnosis not present

## 2018-01-19 DIAGNOSIS — Z3040 Encounter for surveillance of contraceptives, unspecified: Secondary | ICD-10-CM | POA: Diagnosis not present

## 2018-01-19 DIAGNOSIS — N760 Acute vaginitis: Secondary | ICD-10-CM | POA: Diagnosis not present

## 2018-01-19 DIAGNOSIS — R87610 Atypical squamous cells of undetermined significance on cytologic smear of cervix (ASC-US): Secondary | ICD-10-CM | POA: Diagnosis not present

## 2018-01-19 DIAGNOSIS — Z6833 Body mass index (BMI) 33.0-33.9, adult: Secondary | ICD-10-CM | POA: Diagnosis not present

## 2018-01-19 DIAGNOSIS — Z23 Encounter for immunization: Secondary | ICD-10-CM | POA: Diagnosis not present

## 2018-01-19 DIAGNOSIS — N939 Abnormal uterine and vaginal bleeding, unspecified: Secondary | ICD-10-CM | POA: Diagnosis not present

## 2018-01-19 MED FILL — KELNOR 1-35 28 TABLET: 1-35 | 10 days supply | Qty: 28 | Fill #0

## 2018-01-26 DIAGNOSIS — F4321 Adjustment disorder with depressed mood: Secondary | ICD-10-CM | POA: Diagnosis not present

## 2018-01-27 MED FILL — KELNOR 1-35 28 TABLET: 1-35 | 84 days supply | Qty: 84 | Fill #1

## 2018-02-08 DIAGNOSIS — F4321 Adjustment disorder with depressed mood: Secondary | ICD-10-CM | POA: Diagnosis not present

## 2018-02-27 MED FILL — metroNIDAZOLE 0.75 % GEL: 0.75 | 5 days supply | Qty: 70 | Fill #0

## 2018-03-02 DIAGNOSIS — F4321 Adjustment disorder with depressed mood: Secondary | ICD-10-CM | POA: Diagnosis not present

## 2018-03-09 DIAGNOSIS — Z23 Encounter for immunization: Secondary | ICD-10-CM | POA: Diagnosis not present

## 2018-03-09 DIAGNOSIS — R87610 Atypical squamous cells of undetermined significance on cytologic smear of cervix (ASC-US): Secondary | ICD-10-CM | POA: Diagnosis not present

## 2018-03-09 DIAGNOSIS — Z113 Encounter for screening for infections with a predominantly sexual mode of transmission: Secondary | ICD-10-CM | POA: Diagnosis not present

## 2018-03-09 DIAGNOSIS — N939 Abnormal uterine and vaginal bleeding, unspecified: Secondary | ICD-10-CM | POA: Diagnosis not present

## 2018-03-09 DIAGNOSIS — N760 Acute vaginitis: Secondary | ICD-10-CM | POA: Diagnosis not present

## 2018-03-09 DIAGNOSIS — Z3040 Encounter for surveillance of contraceptives, unspecified: Secondary | ICD-10-CM | POA: Diagnosis not present

## 2018-03-15 DIAGNOSIS — F4321 Adjustment disorder with depressed mood: Secondary | ICD-10-CM | POA: Diagnosis not present

## 2018-04-11 MED FILL — KELNOR 1-35 28 TABLET: 1-35 | 84 days supply | Qty: 84 | Fill #2

## 2018-04-28 ENCOUNTER — Encounter: Payer: Self-pay | Admitting: Family Medicine

## 2018-04-28 ENCOUNTER — Ambulatory Visit (INDEPENDENT_AMBULATORY_CARE_PROVIDER_SITE_OTHER): Payer: No Typology Code available for payment source | Admitting: Family Medicine

## 2018-04-28 VITALS — BP 110/80 | HR 86 | Temp 97.0°F | Ht 65.0 in | Wt 208.0 lb

## 2018-04-28 DIAGNOSIS — Z7689 Persons encountering health services in other specified circumstances: Secondary | ICD-10-CM

## 2018-04-28 DIAGNOSIS — J3089 Other allergic rhinitis: Secondary | ICD-10-CM

## 2018-04-28 DIAGNOSIS — H6122 Impacted cerumen, left ear: Secondary | ICD-10-CM | POA: Diagnosis not present

## 2018-04-28 DIAGNOSIS — Z6834 Body mass index (BMI) 34.0-34.9, adult: Secondary | ICD-10-CM | POA: Diagnosis not present

## 2018-04-28 NOTE — Patient Instructions (Signed)
Earwax Buildup, Adult The ears produce a substance called earwax that helps keep bacteria out of the ear and protects the skin in the ear canal. Occasionally, earwax can build up in the ear and cause discomfort or hearing loss. What increases the risk? This condition is more likely to develop in people who:  Are female.  Are elderly.  Naturally produce more earwax.  Clean their ears often with cotton swabs.  Use earplugs often.  Use in-ear headphones often.  Wear hearing aids.  Have narrow ear canals.  Have earwax that is overly thick or sticky.  Have eczema.  Are dehydrated.  Have excess hair in the ear canal. What are the signs or symptoms? Symptoms of this condition include:  Reduced or muffled hearing.  A feeling of fullness in the ear or feeling that the ear is plugged.  Fluid coming from the ear.  Ear pain.  Ear itch.  Ringing in the ear.  Coughing.  An obvious piece of earwax that can be seen inside the ear canal. How is this diagnosed? This condition may be diagnosed based on:  Your symptoms.  Your medical history.  An ear exam. During the exam, your health care provider will look into your ear with an instrument called an otoscope. You may have tests, including a hearing test. How is this treated? This condition may be treated by:  Using ear drops to soften the earwax.  Having the earwax removed by a health care provider. The health care provider may: ? Flush the ear with water. ? Use an instrument that has a loop on the end (curette). ? Use a suction device.  Surgery to remove the wax buildup. This may be done in severe cases. Follow these instructions at home:   Take over-the-counter and prescription medicines only as told by your health care provider.  Do not put any objects, including cotton swabs, into your ear. You can clean the opening of your ear canal with a washcloth or facial tissue.  Follow instructions from your health care  provider about cleaning your ears. Do not over-clean your ears.  Drink enough fluid to keep your urine clear or pale yellow. This will help to thin the earwax.  Keep all follow-up visits as told by your health care provider. If earwax builds up in your ears often or if you use hearing aids, consider seeing your health care provider for routine, preventive ear cleanings. Ask your health care provider how often you should schedule your cleanings.  If you have hearing aids, clean them according to instructions from the manufacturer and your health care provider. Contact a health care provider if:  You have ear pain.  You develop a fever.  You have blood, pus, or other fluid coming from your ear.  You have hearing loss.  You have ringing in your ears that does not go away.  Your symptoms do not improve with treatment.  You feel like the room is spinning (vertigo). Summary  Earwax can build up in the ear and cause discomfort or hearing loss.  The most common symptoms of this condition include reduced or muffled hearing and a feeling of fullness in the ear or feeling that the ear is plugged.  This condition may be diagnosed based on your symptoms, your medical history, and an ear exam.  This condition may be treated by using ear drops to soften the earwax or by having the earwax removed by a health care provider.  Do not put any   objects, including cotton swabs, into your ear. You can clean the opening of your ear canal with a washcloth or facial tissue. This information is not intended to replace advice given to you by your health care provider. Make sure you discuss any questions you have with your health care provider. Document Released: 04/22/2004 Document Revised: 02/24/2017 Document Reviewed: 05/26/2016 Elsevier Interactive Patient Education  2019 [employer].  Exercising to [employer] Exercise is structured, repetitive physical activity to improve fitness and health. Getting  regular exercise is important for everyone. It is especially important if you are overweight. Being overweight increases your risk of heart disease, stroke, diabetes, high blood pressure, and several types of cancer. Reducing your calorie intake and exercising can help you lose weight. Exercise is usually categorized as moderate or vigorous intensity. To lose weight, most people need to do a certain amount of moderate-intensity or vigorous-intensity exercise each week. Moderate-intensity exercise  Moderate-intensity exercise is any activity that gets you moving enough to burn at least three times more energy (calories) than if you were sitting. Examples of moderate exercise include:  Walking a mile in 15 minutes.  Doing light yard work.  Biking at an easy pace. Most people should get at least 150 minutes (2 hours and 30 minutes) a week of moderate-intensity exercise to maintain their body weight. Vigorous-intensity exercise Vigorous-intensity exercise is any activity that gets you moving enough to burn at least six times more calories than if you were sitting. When you exercise at this intensity, you should be working hard enough that you are not able to carry on a conversation. Examples of vigorous exercise include:  Running.  Playing a team sport, such as football, basketball, and soccer.  Jumping rope. Most people should get at least 75 minutes (1 hour and 15 minutes) a week of vigorous-intensity exercise to maintain their body weight. How can exercise affect me? When you exercise enough to burn more calories than you eat, you lose weight. Exercise also reduces body fat and builds muscle. The more muscle you have, the more calories you burn. Exercise also:  Improves mood.  Reduces stress and tension.  Improves your overall fitness, flexibility, and endurance.  Increases bone strength. The amount of exercise you need to lose weight depends on:  Your age.  The type of  exercise.  Any health conditions you have.  Your overall physical ability. Talk to your health care provider about how much exercise you need and what types of activities are safe for you. What actions can I take to lose weight? Nutrition   Make changes to your diet as told by your health care provider or diet and nutrition specialist (dietitian). This may include: ? Eating fewer calories. ? Eating more protein. ? Eating less unhealthy fats. ? Eating a diet that includes fresh fruits and vegetables, whole grains, low-fat dairy products, and lean protein. ? Avoiding foods with added fat, salt, and sugar.  Drink plenty of water while you exercise to prevent dehydration or heat stroke. Activity  Choose an activity that you enjoy and set realistic goals. Your health care provider can help you make an exercise plan that works for you.  Exercise at a moderate or vigorous intensity most days of the week. ? The intensity of exercise may vary from person to person. You can tell how intense a workout is for you by paying attention to your breathing and heartbeat. Most people will notice their breathing and heartbeat get faster with more intense exercise.  Do resistance training twice each week, such as: ? Push-ups. ? Sit-ups. ? Lifting weights. ? Using resistance bands.  Getting short amounts of exercise can be just as helpful as long structured periods of exercise. If you have trouble finding time to exercise, try to include exercise in your daily routine. ? Get up, stretch, and walk around every 30 minutes throughout the day. ? Go for a walk during your lunch break. ? Park your car farther away from your destination. ? If you take public transportation, get off one stop early and walk the rest of the way. ? Make phone calls while standing up and walking around. ? Take the stairs instead of elevators or escalators.  Wear comfortable clothes and shoes with good support.  Do not  exercise so much that you hurt yourself, feel dizzy, or get very short of breath. Where to find more information  U.S. Department of Health and Human Services: ThisPath.fiwww.hhs.gov  Centers for Disease Control and Prevention (CDC): FootballExhibition.com.brwww.cdc.gov Contact a health care provider:  Before starting a new exercise program.  If you have questions or concerns about your weight.  If you have a medical problem that keeps you from exercising. Get help right away if you have any of the following while exercising:  Injury.  Dizziness.  Difficulty breathing or shortness of breath that does not go away when you stop exercising.  Chest pain.  Rapid heartbeat. Summary  Being overweight increases your risk of heart disease, stroke, diabetes, high blood pressure, and several types of cancer.  Losing weight happens when you burn more calories than you eat.  Reducing the amount of calories you eat in addition to getting regular moderate or vigorous exercise each week helps you lose weight. This information is not intended to replace advice given to you by your health care provider. Make sure you discuss any questions you have with your health care provider. Document Released: 04/17/2010 Document Revised: 03/28/2017 Document Reviewed: 03/28/2017 Elsevier Interactive Patient Education  2019 [employer] Inc.  Allergies, Adult An allergy means that your body reacts to something that bothers it (allergen). It is not a normal reaction. This can happen from something that you:  Eat.  Breathe in.  Touch. You can have an allergy (be allergic) to:  Outdoor things, like: ? Pollen. ? Grass. ? Weeds.  Indoor things, like: ? Dust. ? Smoke. ? Pet dander.  Foods.  Medicines.  Things that bother your skin, like: ? Detergents. ? Chemicals. ? Latex.  Perfume.  Bugs. An allergy cannot spread from person to person (is not contagious). Follow these instructions at home:         Stay away from  things that you know you are allergic to.  If you have allergies to things in the air, wash out your nose each day. Do it with one of these: ? A salt-water (saline) spray. ? A container (neti pot).  Take over-the-counter and prescription medicines only as told by your doctor.  Keep all follow-up visits as told by your doctor. This is important.  If you are at risk for a very bad allergy reaction (anaphylaxis), keep an auto-injector with you all the time. This is called an epinephrine injection. ? This is pre-measured medicine with a needle. You can put it into your skin by yourself. ? Right after you have a very bad allergy reaction, you or a person with you must give the medicine in less than a few minutes. This is an emergency.  If  you have ever had a very bad allergy reaction, wear a medical alert bracelet or necklace. Your very bad allergy should be written on it. Contact a health care provider if:  Your symptoms do not get better with treatment. Get help right away if:  You have symptoms of a very bad allergy reaction. These include: ? A swollen mouth, tongue, or throat. ? Pain or tightness in your chest. ? Trouble breathing. ? Being short of breath. ? Dizziness. ? Fainting. ? Very bad pain in your belly (abdomen). ? Throwing up (vomiting). ? Watery poop (diarrhea). Summary  An allergy means that your body reacts to something that bothers it (allergen). It is not a normal reaction.  Stay away from things that make your body react.  Take over-the-counter and prescription medicines only as told by your doctor.  If you are at risk for a very bad allergy reaction, carry an auto-injector (epinephrine injection) all the time. Also, wear a medical alert bracelet or necklace so people know about your allergy. This information is not intended to replace advice given to you by your health care provider. Make sure you discuss any questions you have with your health care  provider. Document Released: 07/10/2012 Document Revised: 06/28/2016 Document Reviewed: 06/28/2016 Elsevier Interactive Patient Education  2019 [employer]

## 2018-04-28 NOTE — Progress Notes (Signed)
Patient presents to clinic today to establish care.  SUBJECTIVE: PMH: Pt is a 28 yo female with pmh sig for allergies and obesity.  Pt was previously seen by Caryn Beeakia Starkes-Perry, NP.  Weight gain: -pt on phentermine 37.5 mg x 2 months, but has only lost maybe 4lbs.   -states is thinking she should stop the med. -pt was on med in the past x 1 yr. -doing cardio at the gym.  Runs 1 mile for warm up then does elliptical -trying to drink more water -has decreased carb intake. -has nuts, popcorn, pickles for snacks.  Does not eat beef or pork.  Allergies: -takes Zyrtec daily -has to dust her ceiling fan blades monthly -notes sensitivity to foods fresh pineapple, muscadine grapes, strawberries cause throat itching -Patient would like referral to allergy  Birth control: -On [facility] OCPs -Followed by OB/GYN.  States has to find a new one as her provider is retiring  Allergies: Bactrim-hives Throat itching with strawberries, muscadine grapes, fresh pineapple  Past surgical history: Wisdom teeth extraction 2013  Social history: Patient is single.  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  Patient has a Event organisermasters degree in social work.  She is currently working on a [employer]BA with concentration and Development worker, international aidhealth management.  Patient endorses social alcohol use.  Patient denies tobacco and drug use.  Family medical history: Mom-alcohol abuse, arthritis, breast cancer, depression, HLD, HTN, mental illness, miscarriage Dad-arthritis, heart disease, HLD, HTN, kidney disease Sister-Tri, depression, mental illness Sister-Aria Sister-TJ, mental illness Brother-Rell Brother-Evan, learning disability Brother-Garvin, learning disability MGM-diabetes, early death, HLD, HTN, kidney disease MGF-alcohol abuse PGM-MI, HLD, HTN PGF-arthritis, diabetes, hearing loss  Health Maintenance: Dental --dental works Vision --[facility] Immunizations --influenza vaccine 2019, TB test  2018 PAP --2019  Past Medical History:  Diagnosis Date  . Family history of breast cancer   . Family history of prostate cancer     Past Surgical History:  Procedure Laterality Date  . WISDOM TOOTH EXTRACTION      Current Outpatient Medications on File Prior to Visit  Medication Sig Dispense Refill  . phentermine 37.5 MG capsule Take 37.5 mg by mouth every morning.     No current facility-administered medications on file prior to visit.     Allergies  Allergen Reactions  . Bactrim [Sulfamethoxazole-Trimethoprim] Rash    Family History  Problem Relation Age of Onset  . Breast cancer Mother [id number]  . Hypertension Maternal Grandmother   . Kidney disease Maternal Grandmother   . Congestive Heart Failure Paternal Grandfather   . Prostate cancer Other        MGF's brother  . Breast cancer Cousin        mother's paternal first cousin    Social History   Socioeconomic History  . Marital status: Single    Spouse name: Not on file  . Number of children: Not on file  . Years of education: Not on file  . Highest education level: Not on file  Occupational History  . Not on file  Social Needs  . Financial resource strain: Not on file  . Food insecurity:    Worry: Not on file    Inability: Not on file  . Transportation needs:    Medical: Not on file    Non-medical: Not on file  Tobacco Use  . Smoking status: Never Smoker  . Smokeless tobacco: Never Used  Substance and Sexual Activity  . Alcohol use: Yes    Comment:  occ  . Drug use: No  . Sexual activity: Not on file  Lifestyle  . Physical activity:    Days per week: Not on file    Minutes per session: Not on file  . Stress: Not on file  Relationships  . Social connections:    Talks on phone: Not on file    Gets together: Not on file    Attends religious service: Not on file    Active member of club or organization: Not on file    Attends meetings of clubs or organizations: Not on file    Relationship status:  Not on file  . Intimate partner violence:    Fear of current or ex partner: Not on file    Emotionally abused: Not on file    Physically abused: Not on file    Forced sexual activity: Not on file  Other Topics Concern  . Not on file  Social History Narrative  . Not on file    ROS General: Denies fever, chills, night sweats, changes in appetite  +weight gain HEENT: Denies headaches, ear pain, changes in vision, rhinorrhea, sore throat CV: Denies CP, palpitations, SOB, orthopnea Pulm: Denies SOB, cough, wheezing GI: Denies abdominal pain, nausea, vomiting, diarrhea, constipation GU: Denies dysuria, hematuria, frequency, vaginal discharge Msk: Denies muscle cramps, joint pains Neuro: Denies weakness, numbness, tingling Skin: Denies rashes, bruising Psych: Denies depression, anxiety, hallucinations  BP 110/80   Pulse 86   Temp (!) 97 F (36.1 C)   Ht 5\' 5"  (1.651 m)   Wt 208 lb (94.3 kg)   SpO2 97%   BMI 34.61 kg/m   Physical Exam Gen. Pleasant, well developed, well-nourished, in NAD HEENT - [place]/AT, PERRL, no scleral icterus, no nasal drainage, pharynx without erythema or exudate. R TM normal.  L canal with cerumen.  5 mm laceration within the Triangular fossa of the L ear.  L TM normal after irrigation. Lungs: no use of accessory muscles, CTAB, no wheezes, rales or rhonchi Cardiovascular: RRR,  No r/g/m, no peripheral edema Abdomen: BS present, soft, nontender,nondistended  Neuro:  A&Ox3, CN II-XII intact, normal gait Skin:  Warm, dry, intact, no lesions  No results found for this or any previous visit (from the past 2160 hour(s)).  Assessment/Plan: Environmental and seasonal allergies -Continue Zyrtec daily -Discussed ways to avoid allergens -consider nasal spray - Plan: Ambulatory referral to Allergy  Impacted cerumen of left ear -Consent obtained.  Ear irrigated.  Patient tolerated procedure well. -Given handout  BMI 34.0-34.9,adult -discussed d/c phentermine  was a good idea as pt is not losing weight -Discussed portion control and other ways to decrease weight gain. -Given handout  Encounter to establish care -We reviewed the PMH, PSH, FH, SH, Meds and Allergies. -We provided refills for any medications we will prescribe as needed. -We addressed current concerns per orders and patient instructions. -We have asked for records for pertinent exams, studies, vaccines and notes from previous providers. -We have advised patient to follow up per instructions below.  F/u prn  Abbe Amsterdam, MD

## 2018-05-15 MED FILL — LEVOCETIRIZINE 5 MG TABLET: 5 | 30 days supply | Qty: 30 | Fill #0

## 2018-05-18 MED FILL — PHENTERMINE 37.5 MG TABLET: 37.5 | 30 days supply | Qty: 30 | Fill #0

## 2018-05-26 ENCOUNTER — Encounter: Payer: Self-pay | Admitting: Allergy

## 2018-05-26 ENCOUNTER — Ambulatory Visit: Payer: No Typology Code available for payment source | Admitting: Allergy

## 2018-05-26 VITALS — BP 126/72 | HR 80 | Temp 97.9°F | Resp 16 | Ht 65.0 in | Wt 209.0 lb

## 2018-05-26 DIAGNOSIS — J3089 Other allergic rhinitis: Secondary | ICD-10-CM

## 2018-05-26 DIAGNOSIS — H1013 Acute atopic conjunctivitis, bilateral: Secondary | ICD-10-CM

## 2018-05-26 DIAGNOSIS — T781XXD Other adverse food reactions, not elsewhere classified, subsequent encounter: Secondary | ICD-10-CM

## 2018-05-26 MED ORDER — OLOPATADINE HCL 0.2 % OP SOLN: 1.0000 [drp] | OPHTHALMIC | 5 refills | Status: AC

## 2018-05-26 NOTE — Progress Notes (Signed)
New Patient Note  RE: Paula Lawrence MRN: [id number] DOB: 1990-08-09 Date of Office Visit: 05/26/2018  Referring provider: Deeann Saint, MD Primary care provider: Deeann Saint, MD  Chief Complaint: allergies and food reactions  History of present illness: Paula Lawrence is a 28 y.o. female presenting today for consultation for allergic rhinitis and food intolerance.    She reports her throat gets itchy with ingestion of pineapple, strawberry, muscadine grapes.  She can eat regular red and green grapes without any issue.  She has not identified any other foods that cause similar symptoms. She will take a benadyrl to relieve the symptoms.  This is been ongoing for many years predating adulthood.  She reports having seasonal allergies.  She reports runny nose, sneezing, itchy eyes, HA.  She started taking xyzal last week.  She was on zyrtec proir to that.  She states the zyrtec was not effective for her.  She does feel xyzal is more effective than zyrtec.  She states she has had increased nasal symptoms since stopping Xyzal for this visit today.  She has not tried any nasal sprays or any eyedrops.  No history of asthma or eczema.    Review of systems: Review of Systems  Constitutional: Negative for chills, fever and malaise/fatigue.  HENT: Positive for congestion. Negative for ear discharge, ear pain, nosebleeds, sinus pain and sore throat.   Eyes: Negative for pain, discharge and redness.  Respiratory: Negative for cough, shortness of breath and wheezing.   Cardiovascular: Negative for chest pain.  Gastrointestinal: Negative for abdominal pain, constipation, diarrhea, heartburn, nausea and vomiting.  Musculoskeletal: Negative for joint pain.  Skin: Negative for itching and rash.  Neurological: Negative for headaches.    All other systems negative unless noted above in HPI  Past medical history: Past Medical History:  Diagnosis Date  . Family  history of breast cancer   . Family history of prostate cancer   . UTI (urinary tract infection)     Past surgical history: Past Surgical History:  Procedure Laterality Date  . WISDOM TOOTH EXTRACTION      Family history:  Family History  Problem Relation Age of Onset  . Breast cancer Mother 16  . Allergic rhinitis Mother   . Asthma Sister   . Allergic rhinitis Sister   . Hypertension Maternal Grandmother   . Kidney disease Maternal Grandmother   . Congestive Heart Failure Paternal Grandfather   . Prostate cancer Other        MGF's brother  . Breast cancer Cousin        mother's paternal first cousin    Social history: She lives in a condo without carpeting with oil heating and central cooling.  There are no pets in the home.  There is no concern for water damage, mildew or roaches in the home.  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  She denies a smoking history.  Medication List: Allergies as of 05/26/2018      Reactions   Bactrim [sulfamethoxazole-trimethoprim] Rash      Medication List       Accurate as of May 26, 2018  5:11 PM. Always use your most recent med list.        cetirizine 5 MG tablet Commonly known as:  ZYRTEC Take 5 mg by mouth daily.   [facility] 1/35 1-35 MG-MCG tablet Generic drug:  ethynodiol-ethinyl estradiol   levocetirizine 5 MG tablet Commonly known as:  XYZAL  MULTIPLE VITAMINS/WOMENS tablet Take 1 tablet by mouth daily.   Olopatadine HCl 0.2 % Soln Commonly known as:  PATADAY Place 1 drop into both eyes 1 day or 1 dose.   phentermine 37.5 MG capsule Take 37.5 mg by mouth every morning.   PROBIOTIC DAILY PO Take by mouth daily at 12 noon.       Known medication allergies: Allergies  Allergen Reactions  . Bactrim [Sulfamethoxazole-Trimethoprim] Rash     Physical examination: Blood pressure 126/72, pulse 80, temperature 97.9 F (36.6 C), temperature source Oral, resp. rate 16, height 5\' 5"  (1.651 m), weight  209 lb (94.8 kg), last menstrual period 05/16/2018, SpO2 98 %.  General: Alert, interactive, in no acute distress. HEENT: PERRLA, TMs pearly gray, turbinates moderately edematous without discharge, post-pharynx non erythematous. Neck: Supple without lymphadenopathy. Lungs: Clear to auscultation without wheezing, rhonchi or rales. {no increased work of breathing. CV: Normal S1, S2 without murmurs. Abdomen: Nondistended, nontender. Skin: Warm and dry, without lesions or rashes. Extremities:  No clubbing, cyanosis or edema. Neuro:   Grossly intact.  Diagnositics/Labs: Allergy testing: environmental allergy skin prick testing is positive to grasses, weeds, trees, molds, dust mites, cockroach, mouse.  Histamine control was positive. Select food allergy skin prick testing to pineapple and strawberry are negative. Intradermal testing is negative. Allergy testing results were read and interpreted by provider, documented by clinical staff.   Assessment and plan:   Allergic rhinitis with conjunctivitis  - environmental allergy skin testing today is positive to grasses, weeds, trees, molds, dust mites, cockroach, mouse  - allergen avoidance measures discussed/handouts provided  - continue Xyzal 5mg  daily - take in evening  - for itchy/watery/red eyes use Pataday 1 drop each eye daily as needed  - for nasal congestion/drainage can use OTC Flonase, Rhinocort or Nasacort 2 sprays each nostril daily as needed.  Use for 1-2 weeks at a time before stopping once symptoms improve  - allergen immunotherapy discussed today including protocol, benefits and risk.  Informational handout provided.  If interested in this therapuetic option you can check with your insurance carrier for coverage.  Let us know if you would like to proceed with this option.     Pollen food allergy syndrome  - The oral allergy syndrome (OAS) or pollen-food allergy syndrome (PFAS) is a relatively common form of food allergy,  particularly in adults. It typically occurs in people who have pollen allergies when the immune system "sees" proteins on the food that look like proteins on the pollen. This results in the allergy antibody (IgE) binding to the food instead of the pollen. Patients typically report itching and/or mild swelling of the mouth and throat immediately following ingestion of certain uncooked fruits (including nuts) or raw vegetables. Only a very small number of affected individuals experience systemic allergic reactions, such as anaphylaxis which occurs with true food allergies.    - completion of allergen immunotherapy as above may allow you to eat fresh fruits without symptoms if pollen sensitivity is treated.    - skin testing to pineapple and strawberry are negative   Follow-up 4-6 months or sooner if needed  I appreciate the opportunity to take part in Aerika's care. Please do not hesitate to contact me with questions.  Sincerely,   Margo Aye, MD Allergy/Immunology [facility]

## 2018-05-26 NOTE — Patient Instructions (Addendum)
Allergies   - environmental allergy skin testing today is positive to grasses, weeds, trees, molds, dust mites, cockroach, mouse  - allergen avoidance measures discussed/handouts provided  - continue Xyzal 5mg  daily - take in evening  - for itchy/watery/red eyes use Pataday 1 drop each eye daily as needed  - for nasal congestion/drainage can use OTC Flonase, Rhinocort or Nasacort 2 sprays each nostril daily as needed.  Use for 1-2 weeks at a time before stopping once symptoms improve  - allergen immunotherapy discussed today including protocol, benefits and risk.  Informational handout provided.  If interested in this therapuetic option you can check with your insurance carrier for coverage.  Let us know if you would like to proceed with this option.     Pollen food allergy syndrome  - The oral allergy syndrome (OAS) or pollen-food allergy syndrome (PFAS) is a relatively common form of food allergy, particularly in adults. It typically occurs in people who have pollen allergies when the immune system "sees" proteins on the food that look like proteins on the pollen. This results in the allergy antibody (IgE) binding to the food instead of the pollen. Patients typically report itching and/or mild swelling of the mouth and throat immediately following ingestion of certain uncooked fruits (including nuts) or raw vegetables. Only a very small number of affected individuals experience systemic allergic reactions, such as anaphylaxis which occurs with true food allergies.    - completion of allergen immunotherapy as above may allow you to eat fresh fruits without symptoms if pollen sensitivity is treated.    - skin testing to pineapple and strawberry are negative    Follow-up 4-6 months or sooner if needed

## 2018-06-02 ENCOUNTER — Ambulatory Visit (INDEPENDENT_AMBULATORY_CARE_PROVIDER_SITE_OTHER): Payer: No Typology Code available for payment source | Admitting: Family Medicine

## 2018-06-02 ENCOUNTER — Other Ambulatory Visit (HOSPITAL_COMMUNITY)
Admission: RE | Admit: 2018-06-02 | Discharge: 2018-06-02 | Disposition: A | Discharge status: Home / Self Care | Payer: No Typology Code available for payment source | Source: Ambulatory Visit | Attending: Family Medicine | Admitting: Family Medicine

## 2018-06-02 ENCOUNTER — Encounter: Payer: Self-pay | Admitting: Family Medicine

## 2018-06-02 VITALS — BP 108/78 | HR 78 | Temp 98.2°F | Wt 208.0 lb

## 2018-06-02 DIAGNOSIS — N898 Other specified noninflammatory disorders of vagina: Secondary | ICD-10-CM | POA: Insufficient documentation

## 2018-06-02 MED FILL — NYSTATIN 100,000 UNITS/GM O: 100000 | 20 days supply | Qty: 30 | Fill #1

## 2018-06-02 NOTE — Progress Notes (Signed)
Subjective:    Patient ID: Paula Lawrence, female    DOB: [birth year], 28 y.o.   MRN: [id number]  No chief complaint on file.   HPI Patient was seen today for acute concern.  Pt endorses increased vaginal discharge x2 weeks.  Discharge is noted as thick and white.  Pt denies changes in soaps, lotions, detergents.  Pt denies discomfort, irritation, dysuria, urinary frequency, back pain, fever, nausea, vomiting, chills.  Pt has been drinking more water and working to lose weight.  Pt requesting STI testing, notes had recent HIV and RPR.  Past Medical History:  Diagnosis Date  . Family history of breast cancer   . Family history of prostate cancer   . UTI (urinary tract infection)     Allergies  Allergen Reactions  . Bactrim [Sulfamethoxazole-Trimethoprim] Rash    ROS General: Denies fever, chills, night sweats, changes in weight, changes in appetite HEENT: Denies headaches, ear pain, changes in vision, rhinorrhea, sore throat CV: Denies CP, palpitations, SOB, orthopnea Pulm: Denies SOB, cough, wheezing GI: Denies abdominal pain, nausea, vomiting, diarrhea, constipation GU: Denies dysuria, hematuria, frequency  +vaginal discharge Msk: Denies muscle cramps, joint pains Neuro: Denies weakness, numbness, tingling Skin: Denies rashes, bruising Psych: Denies depression, anxiety, hallucinations    Objective:    Blood pressure 108/78, pulse 78, temperature 98.2 F (36.8 C), temperature source Oral, weight 208 lb (94.3 kg), last menstrual period 05/16/2018, SpO2 95 %.   Gen. Pleasant, well-nourished, in no distress, normal affect  HEENT: [place]/AT, face symmetric, no scleral icterus, PERRLA, nares patent without drainage Lungs: no accessory muscle use Cardiovascular: RRR, no peripheral edema Abdomen: BS present, soft, NT/ND GU: pt self swab obtained Neuro:  A&Ox3, CN II-XII intact, normal gait Skin:  Warm, no lesions/ rash  Wt Readings from Last 3 Encounters:  06/02/18 208  lb (94.3 kg)  05/26/18 209 lb (94.8 kg)  04/28/18 208 lb (94.3 kg)    Lab Results  Component Value Date   HGB 12.2 08/18/2014   HCT 36.0 08/18/2014   GLUCOSE 121 (H) 08/18/2014   NA 140 08/18/2014   K 3.4 (L) 08/18/2014   CL 99 (L) 08/18/2014   CREATININE 0.90 08/18/2014   BUN 7 08/18/2014    Assessment/Plan:  Vaginal discharge  - Plan: Cervicovaginal ancillary only -Vaginal swab collected by patient -We will send to lab for GC, trichomoniasis, yeast -Handout given -We will call patient with results  F/u PRN  Abbe Amsterdam, MD

## 2018-06-02 NOTE — Patient Instructions (Signed)
Vaginitis    Vaginitis is irritation and swelling (inflammation) of the vagina. It happens when normal bacteria and yeast in the vagina grow too much. There are many types of this condition. Treatment will depend on the type you have.  Follow these instructions at home:  Lifestyle  · Keep your vagina area clean and dry.  ? Avoid using soap.  ? Rinse the area with water.  · Do not do the following until your doctor says it is okay:  ? Wash and clean out the vagina (douche).  ? Use tampons.  ? Have sex.  · Wipe from front to back after going to the bathroom.  · Let air reach your vagina.  ? Wear cotton underwear.  ? Do not wear:  ? Underwear while you sleep.  ? Tight pants.  ? Thong underwear.  ? Underwear or nylons without a cotton panel.  ? Take off any wet clothing, such as bathing suits, as soon as possible.  · Use gentle, non-scented products. Do not use things that can irritate the vagina, such as fabric softeners. Avoid the following products if they are scented:  ? Feminine sprays.  ? Detergents.  ? Tampons.  ? Feminine hygiene products.  ? Soaps or bubble baths.  · Practice safe sex and use condoms.  General instructions  · Take over-the-counter and prescription medicines only as told by your doctor.  · If you were prescribed an antibiotic medicine, take or use it as told by your doctor. Do not stop taking or using the antibiotic even if you start to feel better.  · Keep all follow-up visits as told by your doctor. This is important.  Contact a doctor if:  · You have pain in your belly.  · You have a fever.  · Your symptoms last for more than 2-3 days.  Get help right away if:  · You have a fever and your symptoms get worse all of a sudden.  Summary  · Vaginitis is irritation and swelling of the vagina. It can happen when the normal bacteria and yeast in the vagina grow too much. There are many types.  · Treatment will depend on the type you have.  · Do not douche, use tampons , or have sex until your health  care provider approves. When you can return to sex, practice safe sex and use condoms.  This information is not intended to replace advice given to you by your health care provider. Make sure you discuss any questions you have with your health care provider.  Document Released: 06/11/2008 Document Revised: 04/06/2016 Document Reviewed: 04/06/2016  Elsevier Interactive Patient Education © 2019 [employer]

## 2018-06-06 ENCOUNTER — Other Ambulatory Visit: Payer: Self-pay | Admitting: Family Medicine

## 2018-06-06 LAB — CERVICOVAGINAL ANCILLARY ONLY
Bacterial vaginitis: NEGATIVE
CANDIDA VAGINITIS: POSITIVE — AB
CHLAMYDIA, DNA PROBE: NEGATIVE
NEISSERIA GONORRHEA: NEGATIVE
TRICH (WINDOWPATH): NEGATIVE

## 2018-06-06 MED ORDER — FLUCONAZOLE 150 MG PO TABS: 150.0000 mg | ORAL_TABLET | Freq: Once | ORAL | 0 refills | Status: AC

## 2018-06-07 MED FILL — FLUCONAZOLE 150 MG TABS: 150 | 1 days supply | Qty: 1 | Fill #0

## 2018-06-13 MED FILL — OLOPATADINE HCL 0.2% EYE DR: 0.2 | 25 days supply | Qty: 3 | Fill #0

## 2018-06-13 MED FILL — KELNOR 1-35 28 TABLET: 1-35 | 84 days supply | Qty: 84 | Fill #0

## 2018-06-15 MED FILL — LEVOCETIRIZINE 5 MG TABLET: 5 | 30 days supply | Qty: 30 | Fill #1

## 2018-06-15 MED FILL — PHENTERMINE 37.5 MG TABLET: 37.5 | 30 days supply | Qty: 30 | Fill #1

## 2018-07-20 MED FILL — PHENTERMINE 37.5 MG TABLET: 37.5 | 30 days supply | Qty: 30 | Fill #2

## 2018-09-20 MED FILL — KELNOR 1-35 28 TABLET: 1-35 | 28 days supply | Qty: 28 | Fill #1

## 2018-11-24 ENCOUNTER — Ambulatory Visit: Payer: No Typology Code available for payment source | Admitting: Allergy

## 2021-07-15 ENCOUNTER — Ambulatory Visit
Admission: RE | Admit: 2021-07-15 | Discharge: 2021-07-15 | Disposition: A | Discharge status: Home / Self Care | Payer: PRIVATE HEALTH INSURANCE | Source: Ambulatory Visit | Attending: Urgent Care | Admitting: Urgent Care

## 2021-07-15 VITALS — BP 139/84 | HR 86 | Temp 98.4°F | Resp 16

## 2021-07-15 DIAGNOSIS — L732 Hidradenitis suppurativa: Secondary | ICD-10-CM

## 2021-07-15 MED ORDER — DOXYCYCLINE HYCLATE 100 MG PO CAPS: 100.0000 mg | ORAL_CAPSULE | Freq: Two times a day (BID) | ORAL | 0 refills | Status: AC

## 2021-07-15 MED ORDER — NAPROXEN 500 MG PO TABS: 500.0000 mg | ORAL_TABLET | Freq: Two times a day (BID) | ORAL | 0 refills | Status: AC

## 2021-07-15 NOTE — Discharge Instructions (Addendum)
Please change your dressing 2-5 times daily. Do not apply any ointments or creams. Each time you change your dressing, make sure that you are pressing on the wound to get pus to come out.  Try your best to have a family member help you clean gently around the perimeter of the wound with gentle soap and warm water. Pat your wound dry and let it air out if possible to make sure it is dry before reapplying another dressing.  ? ?Start doxycycline for the infection. Use naproxen for the pain.  ? ?

## 2021-07-15 NOTE — ED Triage Notes (Signed)
Pt here with non-draining abscess in left axilla x 4 days. Pt states it is very painful to lift arm. States the abscess was draining a few days ago, but has stopped.  ?

## 2021-07-15 NOTE — ED Provider Notes (Signed)
?[facility] ? ? ?MRN: [id number] DOB: 1991-01-26 ? ?Subjective:  ? ?Paula Lawrence is a 30 y.o. female presenting for 4 day history of acute onset recurrent left axillary pain, swelling.  Has a history of hidradenitis suppurativa.  Patient sees dermatologist but has not been back in a year.  Has not had issues with an abscess for about a year and a half.  Unfortunately, this episode is not draining spontaneously. ? ?No current facility-administered medications for this encounter. ? ?Current Outpatient Medications:  ?  cetirizine (ZYRTEC) 5 MG tablet, Take 5 mg by mouth daily., Disp: , Rfl:  ?  [facility] 1/35 1-35 MG-MCG tablet, , Disp: , Rfl:  ?  levocetirizine (XYZAL) 5 MG tablet, , Disp: , Rfl:  ?  Multiple Vitamins-Minerals (MULTIPLE VITAMINS/WOMENS) tablet, Take 1 tablet by mouth daily., Disp: , Rfl:  ?  Olopatadine HCl (PATADAY) 0.2 % SOLN, Place 1 drop into both eyes 1 day or 1 dose., Disp: 1 Bottle, Rfl: 5 ?  phentermine 37.5 MG capsule, Take 37.5 mg by mouth every morning., Disp: , Rfl:  ?  Probiotic Product (PROBIOTIC DAILY PO), Take by mouth daily at 12 noon., Disp: , Rfl:   ? ?Allergies  ?Allergen Reactions  ? Bactrim [Sulfamethoxazole-Trimethoprim] Rash  ? ? ?Past Medical History:  ?Diagnosis Date  ? Family history of breast cancer   ? Family history of prostate cancer   ? UTI (urinary tract infection)   ?  ? ?Past Surgical History:  ?Procedure Laterality Date  ? WISDOM TOOTH EXTRACTION    ? ? ?Family History  ?Problem Relation Age of Onset  ? Breast cancer Mother 67  ? Allergic rhinitis Mother   ? Asthma Sister   ? Allergic rhinitis Sister   ? Hypertension Maternal Grandmother   ? Kidney disease Maternal Grandmother   ? Congestive Heart Failure Paternal Grandfather   ? Prostate cancer Other   ?     MGF's brother  ? Breast cancer Cousin   ?     mother's paternal first cousin  ? ? ?Social History  ? ?Tobacco Use  ? Smoking status: Never  ? Smokeless tobacco: Never  ?Vaping Use  ?  Vaping Use: Never used  ?Substance Use Topics  ? Alcohol use: Yes  ?  Comment: occ  ? Drug use: No  ? ? ?ROS ? ? ?Objective:  ? ?Vitals: ?BP 139/84 (BP Location: Left Arm)   Pulse 86   Temp 98.4 ?F (36.9 ?C) (Oral)   Resp 16   LMP 06/30/2021 (Approximate)   SpO2 99%  ? ?Physical Exam ?Constitutional:   ?   General: She is not in acute distress. ?   Appearance: Normal appearance. She is well-developed. She is not ill-appearing, toxic-appearing or diaphoretic.  ?HENT:  ?   Head: Normocephalic and atraumatic.  ?   Nose: Nose normal.  ?   Mouth/Throat:  ?   Mouth: Mucous membranes are moist.  ?Eyes:  ?   General: No scleral icterus.    ?   Right eye: No discharge.     ?   Left eye: No discharge.  ?   Extraocular Movements: Extraocular movements intact.  ?Cardiovascular:  ?   Rate and Rhythm: Normal rate.  ?Pulmonary:  ?   Effort: Pulmonary effort is normal.  ?Skin: ?   General: Skin is warm and dry.  ?   Comments: ~1cm area of exquisite tenderness with swelling and central fluctuance over the left mid axillary region.  ?  Neurological:  ?   General: No focal deficit present.  ?   Mental Status: She is alert and oriented to person, place, and time.  ?Psychiatric:     ?   Mood and Affect: Mood normal.     ?   Behavior: Behavior normal.  ? ?PROCEDURE NOTE: I&D of Abscess ?Verbal consent obtained. Local anesthesia with 3cc of 1% lidocaine with epinephrine. Site cleansed with alcohol swab. Incision of 1/4cm was made using an 11 blade, 1cc expressed consisting of a mixture of pus and serosanguinous fluid. Wound cavity was explored with curved hemostats and loculations loosened. Cleansed and dressed. ? ? ?Assessment and Plan :  ? ?PDMP not reviewed this encounter. ? ?1. Hidradenitis axillaris   ? ? ?Successful incision and drainage.  Start doxycycline, wound care discussed.  Naproxen for pain and inflammation.  Follow-up with dermatologist soon as possible.  Counseled patient on potential for adverse effects with  medications prescribed/recommended today, ER and return-to-clinic precautions discussed, patient verbalized understanding. ? ?  ?Wallis Bamberg, PA-C ?07/15/21 [id number] ? ?
